# Patient Record
Sex: Female | Born: 1995 | Race: White | Hispanic: No | Marital: Single | Smoking: Current every day smoker
Health system: Southern US, Community
[De-identification: ages and names within clinical notes are randomized; demographics above are authoritative.]

## PROBLEM LIST (undated history)

## (undated) DIAGNOSIS — F329 Major depressive disorder, single episode, unspecified: Secondary | ICD-10-CM

## (undated) DIAGNOSIS — F32A Depression, unspecified: Secondary | ICD-10-CM

## (undated) DIAGNOSIS — F419 Anxiety disorder, unspecified: Secondary | ICD-10-CM

## (undated) HISTORY — PX: TONSILLECTOMY: SUR1361

## (undated) HISTORY — PX: TRACHEOSTOMY: SUR1362

---
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
## 2015-06-29 ENCOUNTER — Emergency Department
Admission: EM | Admit: 2015-06-29 | Discharge: 2015-06-30 | Disposition: A | Discharge status: Other Acute Inpatient Hospital | Payer: Medicaid Other | Attending: Emergency Medicine | Admitting: Emergency Medicine

## 2015-06-29 ENCOUNTER — Encounter: Payer: Self-pay | Admitting: Emergency Medicine

## 2015-06-29 DIAGNOSIS — F1210 Cannabis abuse, uncomplicated: Secondary | ICD-10-CM | POA: Insufficient documentation

## 2015-06-29 DIAGNOSIS — R45851 Suicidal ideations: Secondary | ICD-10-CM | POA: Diagnosis present

## 2015-06-29 DIAGNOSIS — F321 Major depressive disorder, single episode, moderate: Secondary | ICD-10-CM | POA: Insufficient documentation

## 2015-06-29 DIAGNOSIS — F1410 Cocaine abuse, uncomplicated: Secondary | ICD-10-CM | POA: Diagnosis not present

## 2015-06-29 DIAGNOSIS — F1510 Other stimulant abuse, uncomplicated: Secondary | ICD-10-CM | POA: Insufficient documentation

## 2015-06-29 DIAGNOSIS — Z720 Tobacco use: Secondary | ICD-10-CM | POA: Insufficient documentation

## 2015-06-29 DIAGNOSIS — Z3202 Encounter for pregnancy test, result negative: Secondary | ICD-10-CM | POA: Diagnosis not present

## 2015-06-29 LAB — CBC
HEMATOCRIT: 43.6 % (ref 35.0–47.0)
HEMOGLOBIN: 14.9 g/dL (ref 12.0–16.0)
MCH: 30.4 pg (ref 26.0–34.0)
MCHC: 34.1 g/dL (ref 32.0–36.0)
MCV: 89.3 fL (ref 80.0–100.0)
Platelets: 213 10*3/uL (ref 150–440)
RBC: 4.88 MIL/uL (ref 3.80–5.20)
RDW: 13 % (ref 11.5–14.5)
WBC: 9.1 10*3/uL (ref 3.6–11.0)

## 2015-06-29 LAB — COMPREHENSIVE METABOLIC PANEL
ALT: 10 U/L — ABNORMAL LOW (ref 14–54)
ANION GAP: 9 (ref 5–15)
AST: 13 U/L — ABNORMAL LOW (ref 15–41)
Albumin: 4.6 g/dL (ref 3.5–5.0)
Alkaline Phosphatase: 56 U/L (ref 38–126)
BILIRUBIN TOTAL: 0.3 mg/dL (ref 0.3–1.2)
BUN: 13 mg/dL (ref 6–20)
CO2: 21 mmol/L — ABNORMAL LOW (ref 22–32)
Calcium: 9.9 mg/dL (ref 8.9–10.3)
Chloride: 109 mmol/L (ref 101–111)
Creatinine, Ser: 0.62 mg/dL (ref 0.44–1.00)
Glucose, Bld: 100 mg/dL — ABNORMAL HIGH (ref 65–99)
POTASSIUM: 4.2 mmol/L (ref 3.5–5.1)
Sodium: 139 mmol/L (ref 135–145)
TOTAL PROTEIN: 7.8 g/dL (ref 6.5–8.1)

## 2015-06-29 LAB — URINE DRUG SCREEN, QUALITATIVE (ARMC ONLY)
AMPHETAMINES, UR SCREEN: NOT DETECTED
BENZODIAZEPINE, UR SCRN: NOT DETECTED
Barbiturates, Ur Screen: NOT DETECTED
COCAINE METABOLITE, UR ~~LOC~~: POSITIVE — AB
Cannabinoid 50 Ng, Ur ~~LOC~~: POSITIVE — AB
MDMA (Ecstasy)Ur Screen: NOT DETECTED
METHADONE SCREEN, URINE: POSITIVE — AB
OPIATE, UR SCREEN: NOT DETECTED
Phencyclidine (PCP) Ur S: NOT DETECTED
Tricyclic, Ur Screen: NOT DETECTED

## 2015-06-29 LAB — POCT PREGNANCY, URINE: Preg Test, Ur: NEGATIVE

## 2015-06-29 NOTE — ED Notes (Signed)
Report received from [facility] B.RN. Pt. Alert and oriented in no distress denies SI, HI, and pain.  Pt. Instructed to come to me with problems or concerns.Will continue to monitor for safety via security cameras and Q 15 minute checks.
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
## 2015-06-29 NOTE — ED Notes (Signed)
BEHAVIORAL HEALTH ROUNDING Patient sleeping: Yes.   Patient alert and oriented: not applicable Behavior appropriate: Yes.   Nutrition and fluids offered: No  Toileting and hygiene offered: No Sitter present: yes Law enforcement present: Yes  

## 2015-06-29 NOTE — ED Notes (Signed)
Pt to ed with IVC papers and [employer].  Pt states she went to [facility] because she has been hearing voices and seeing "visions"  Denies currently being on meds but states she wanted to get help,  Pt reports she has in the past had thoughts of hurting herself but states " I would never do that and hurt my family"  Pt sent from [facility] for "suicidal thoughts.  Pt crying at triage.

## 2015-06-29 NOTE — ED Notes (Signed)
MD at bedside. 

## 2015-06-29 NOTE — ED Notes (Signed)
Pt refusing to talk to family member at this time, phone number [phone].

## 2015-06-29 NOTE — ED Notes (Signed)
BEHAVIORAL HEALTH ROUNDING Patient sleeping: No. Patient alert and oriented: yes Behavior appropriate: Yes.  ; If no, describe: n/a Nutrition and fluids offered: Yes  Toileting and hygiene offered: No Sitter present: yes Law enforcement present: Yes

## 2015-06-29 NOTE — ED Provider Notes (Signed)
[facility] Emergency Department Provider Note  ____________________________________________  Time seen: On arrival  I have reviewed the triage vital signs and the nursing notes.   HISTORY  Chief Complaint Suicidal  History limited by patient's lack of cooperation  HPI Denise Gibbs is a 19 y.o. female who presented to [facility] and complained of SI and depression.They placed her under involuntary commitment and sent her to the ED. She will not tell me why she is depressed. She is tearful and upset. She denies ingestions or alcohol. She denies drug use to me     History reviewed. No pertinent past medical history.  There are no active problems to display for this patient.   Past Surgical History  Procedure Laterality Date  . Tonsillectomy      No current outpatient prescriptions on file.  Allergies Review of patient's allergies indicates no known allergies.  History reviewed. No pertinent family history.  Social History Social History  Substance Use Topics  . Smoking status: Current Every Day Smoker  . Smokeless tobacco: None  . Alcohol Use: Yes    Review of Systems   Eyes: Negative for visual changes. ENT: Negative for sore throat Cardiovascular: Negative for chest pain. Respiratory: Negative for shortness of breath. Gastrointestinal: Negative for abdominal pain, vomiting and diarrhea. Genitourinary: Negative for dysuria. Musculoskeletal: Negative for back pain. Skin: Negative for rash. Neurological: Negative for headaches or focal weakness Psychiatric: Positive depression positive suicidal ideation    ____________________________________________   PHYSICAL EXAM:  VITAL SIGNS: ED Triage Vitals  Enc Vitals Group     BP 06/29/15 1310 124/82 mmHg     Pulse Rate 06/29/15 1310 82     Resp 06/29/15 1310 20     Temp 06/29/15 1310 98.2 F (36.8 C)     Temp Source 06/29/15 1310 Oral     SpO2 06/29/15 1310 100 %     Weight  06/29/15 1310 214 lb (97.07 kg)     Height 06/29/15 1310  (1.651 m)     Head Cir --      Peak Flow --      Pain Score 06/29/15 1316 0     Pain Loc --      Pain Edu? --      Excl. in GC? --      Constitutional: Alert and oriented. Tearful and uncooperative Eyes: Conjunctivae are normal.  ENT   Head: Normocephalic and atraumatic.   Mouth/Throat: Mucous membranes are moist. Cardiovascular: Normal rate, regular rhythm. Normal and symmetric distal pulses are present in all extremities. No murmurs, rubs, or gallops. Respiratory: Normal respiratory effort without tachypnea nor retractions. Breath sounds are clear and equal bilaterally.  Gastrointestinal: Soft and non-tender in all quadrants. No distention. There is no CVA tenderness. Genitourinary: deferred Musculoskeletal: Nontender with normal range of motion in all extremities. No lower extremity tenderness nor edema. Neurologic:  Normal speech and language. No gross focal neurologic deficits are appreciated. Skin:  Skin is warm, dry and intact. No rash noted. Psychiatric: Mood and affect are normal. Patient exhibits appropriate insight and judgment.  ____________________________________________    LABS (pertinent positives/negatives)  Labs Reviewed  COMPREHENSIVE METABOLIC PANEL - Abnormal; Notable for the following:    CO2 21 (*)    Glucose, Bld 100 (*)    AST 13 (*)    ALT 10 (*)    All other components within normal limits  URINE DRUG SCREEN, QUALITATIVE ([facility] ONLY) - Abnormal; Notable for the following:  Cocaine Metabolite,Ur [place] POSITIVE (*)    Cannabinoid 50 Ng, Ur [place] POSITIVE (*)    Methadone Scn, Ur POSITIVE (*)    All other components within normal limits  CBC  POCT PREGNANCY, URINE    ____________________________________________   EKG  None  ____________________________________________    RADIOLOGY I have personally reviewed any xrays that were ordered on this  patient: None  ____________________________________________   PROCEDURES  Procedure(s) performed: none  Critical Care performed: none  ____________________________________________   INITIAL IMPRESSION / ASSESSMENT AND PLAN / ED COURSE  Pertinent labs & imaging results that were available during my care of the patient were reviewed by me and considered in my medical decision making (see chart for details).  Patient is under involuntary commitment. I have consulted TTS and psychiatry to evaluate her. At this point she is medically cleared  ____________________________________________   FINAL CLINICAL IMPRESSION(S) / ED DIAGNOSES  Final diagnoses:  Moderate single current episode of major depressive disorder [facility])     Jene Every, MD 06/29/15 [phone]

## 2015-06-29 NOTE — ED Notes (Signed)
Report called to [facility]. Pt to move to BHU 6

## 2015-06-29 NOTE — ED Notes (Signed)
BEHAVIORAL HEALTH ROUNDING Patient sleeping: No. Patient alert and oriented: yes Behavior appropriate: Yes.  ; pt is continuing to cry, upset about being here, waiting to see psychiatrist Nutrition and fluids offered: Yes  Toileting and hygiene offered: Yes  Sitter present: yes Law enforcement present: Yes

## 2015-06-29 NOTE — ED Notes (Signed)

## 2015-06-29 NOTE — ED Notes (Signed)
Pt. Noted in room resting quietly;. No complaints or concerns voiced. No distress or abnormal behavior noted. Will continue to monitor with security cameras. Q 15 minute rounds continue. 

## 2015-06-29 NOTE — ED Notes (Signed)
BEHAVIORAL HEALTH ROUNDING Patient sleeping: Yes.   Patient alert and oriented: yes Behavior appropriate: Yes.  ;  Nutrition and fluids offered: Yes  Toileting and hygiene offered: Yes  Sitter present: yes Law enforcement present: Yes  

## 2015-06-29 NOTE — ED Notes (Signed)
Pt. EDBHU from ED ambulatory without difficulty, to room B-6 . Report from RN. Pt. Is alert and oriented, warm and dry in no distress. Pt. Denies SI, HI, and AVH. Pt. Calm and cooperative. Pt. Made aware of security cameras and Q15 minute rounds. Pt. Encouraged to let Nursing staff know of any concerns or needs.

## 2015-06-29 NOTE — ED Notes (Signed)
ED BHU PLACEMENT JUSTIFICATION Is the patient under IVC or is there intent for IVC: Yes.   Is the patient medically cleared: Yes.   Is there vacancy in the ED BHU: No. Is the population mix appropriate for patient: Yes.   Is the patient awaiting placement in inpatient or outpatient setting: No. Has the patient had a psychiatric consult: No. Survey of unit performed for contraband, proper placement and condition of furniture, tampering with fixtures in bathroom, shower, and each patient room: Yes.  ; Findings: n/a APPEARANCE/BEHAVIOR Pt tearful on assessment, but cooperative. Stating she just wants to go home. NEURO ASSESSMENT Orientation: time, place and person Hallucinations: Yes.  Auditory Hallucinations Speech: Normal Gait: normal RESPIRATORY ASSESSMENT Normal expansion.  Clear to auscultation.  No rales, rhonchi, or wheezing. CARDIOVASCULAR ASSESSMENT Regular rate, rhythm  GASTROINTESTINAL ASSESSMENT soft, nontender, BS WNL, no r/g EXTREMITIES normal strength, tone, and muscle mass PLAN OF CARE Provide calm/safe environment. Vital signs assessed twice daily. ED BHU Assessment once each 12-hour shift. Collaborate with intake RN daily or as condition indicates. Assure the ED provider has rounded once each shift. Provide and encourage hygiene. Provide redirection as needed. Assess for escalating behavior; address immediately and inform ED provider.  Assess family dynamic and appropriateness for visitation as needed: Yes.  ; If necessary, describe findings: n/a Educate the patient/family about BHU procedures/visitation: Yes.  ; If necessary, describe findings: n/a

## 2015-06-29 NOTE — ED Notes (Signed)
BEHAVIORAL HEALTH ROUNDING Patient sleeping: Yes.   Patient alert and oriented: yes Behavior appropriate: Yes.  ; If no, describe: n/a Nutrition and fluids offered: Yes  Toileting and hygiene offered: Yes  Sitter present: yes Law enforcement present: Yes  
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
## 2015-06-30 ENCOUNTER — Inpatient Hospital Stay
Admission: AD | Admit: 2015-06-30 | Discharge: 2015-07-01 | DRG: 897 | Disposition: A | Discharge status: Home / Self Care | Payer: Medicaid Other | Source: Ambulatory Visit | Attending: Psychiatry | Admitting: Psychiatry

## 2015-06-30 DIAGNOSIS — Z813 Family history of other psychoactive substance abuse and dependence: Secondary | ICD-10-CM | POA: Diagnosis not present

## 2015-06-30 DIAGNOSIS — F17200 Nicotine dependence, unspecified, uncomplicated: Secondary | ICD-10-CM | POA: Diagnosis present

## 2015-06-30 DIAGNOSIS — Z818 Family history of other mental and behavioral disorders: Secondary | ICD-10-CM

## 2015-06-30 DIAGNOSIS — F1410 Cocaine abuse, uncomplicated: Secondary | ICD-10-CM

## 2015-06-30 DIAGNOSIS — F1220 Cannabis dependence, uncomplicated: Secondary | ICD-10-CM

## 2015-06-30 DIAGNOSIS — F1994 Other psychoactive substance use, unspecified with psychoactive substance-induced mood disorder: Secondary | ICD-10-CM | POA: Diagnosis not present

## 2015-06-30 DIAGNOSIS — G4700 Insomnia, unspecified: Secondary | ICD-10-CM | POA: Diagnosis present

## 2015-06-30 DIAGNOSIS — F3289 Other specified depressive episodes: Secondary | ICD-10-CM

## 2015-06-30 DIAGNOSIS — F1914 Other psychoactive substance abuse with psychoactive substance-induced mood disorder: Secondary | ICD-10-CM | POA: Diagnosis present

## 2015-06-30 DIAGNOSIS — R45851 Suicidal ideations: Secondary | ICD-10-CM | POA: Diagnosis present

## 2015-06-30 DIAGNOSIS — F1110 Opioid abuse, uncomplicated: Secondary | ICD-10-CM | POA: Diagnosis present

## 2015-06-30 DIAGNOSIS — Z9889 Other specified postprocedural states: Secondary | ICD-10-CM | POA: Diagnosis not present

## 2015-06-30 DIAGNOSIS — F19929 Other psychoactive substance use, unspecified with intoxication, unspecified: Secondary | ICD-10-CM

## 2015-06-30 DIAGNOSIS — Z6834 Body mass index (BMI) 34.0-34.9, adult: Secondary | ICD-10-CM | POA: Diagnosis not present

## 2015-06-30 DIAGNOSIS — F419 Anxiety disorder, unspecified: Secondary | ICD-10-CM | POA: Diagnosis present

## 2015-06-30 DIAGNOSIS — F329 Major depressive disorder, single episode, unspecified: Secondary | ICD-10-CM | POA: Diagnosis present

## 2015-06-30 DIAGNOSIS — F603 Borderline personality disorder: Secondary | ICD-10-CM

## 2015-06-30 DIAGNOSIS — Z62810 Personal history of physical and sexual abuse in childhood: Secondary | ICD-10-CM | POA: Diagnosis present

## 2015-06-30 DIAGNOSIS — F4010 Social phobia, unspecified: Secondary | ICD-10-CM | POA: Diagnosis present

## 2015-06-30 DIAGNOSIS — Z811 Family history of alcohol abuse and dependence: Secondary | ICD-10-CM | POA: Diagnosis not present

## 2015-06-30 DIAGNOSIS — E669 Obesity, unspecified: Secondary | ICD-10-CM | POA: Diagnosis present

## 2015-06-30 DIAGNOSIS — F1590 Other stimulant use, unspecified, uncomplicated: Secondary | ICD-10-CM

## 2015-06-30 DIAGNOSIS — R441 Visual hallucinations: Secondary | ICD-10-CM | POA: Diagnosis present

## 2015-06-30 MED ORDER — FLUOXETINE HCL 10 MG PO CAPS
10.0000 mg | ORAL_CAPSULE | Freq: Every day | ORAL | Status: DC
  Administered 2015-06-30 – 2015-07-01 (×2): 10 mg via ORAL
  Filled 2015-06-30 (×2): qty 1

## 2015-06-30 MED ORDER — MAGNESIUM HYDROXIDE 400 MG/5ML PO SUSP: 30.0000 mL | Freq: Every day | ORAL | Status: DC | PRN

## 2015-06-30 MED ORDER — RISPERIDONE 1 MG PO TABS
0.5000 mg | ORAL_TABLET | Freq: Two times a day (BID) | ORAL | Status: DC
  Administered 2015-06-30 (×2): 0.5 mg via ORAL
  Filled 2015-06-30 (×2): qty 1

## 2015-06-30 MED ORDER — INFLUENZA VAC SPLIT QUAD 0.5 ML IM SUSY
0.5000 mL | PREFILLED_SYRINGE | INTRAMUSCULAR | Status: AC
  Administered 2015-07-01: 0.5 mL via INTRAMUSCULAR
  Filled 2015-06-30: qty 0.5

## 2015-06-30 MED ORDER — ALUM & MAG HYDROXIDE-SIMETH 200-200-20 MG/5ML PO SUSP: 30.0000 mL | ORAL | Status: DC | PRN

## 2015-06-30 MED ORDER — ACETAMINOPHEN 325 MG PO TABS
650.0000 mg | ORAL_TABLET | Freq: Four times a day (QID) | ORAL | Status: DC | PRN
  Administered 2015-06-30: 650 mg via ORAL
  Filled 2015-06-30: qty 2

## 2015-06-30 MED ORDER — TRAZODONE HCL 100 MG PO TABS: 100.0000 mg | ORAL_TABLET | Freq: Every evening | ORAL | Status: DC | PRN

## 2015-06-30 MED ORDER — NICOTINE 14 MG/24HR TD PT24
14.0000 mg | MEDICATED_PATCH | Freq: Every day | TRANSDERMAL | Status: DC
  Administered 2015-06-30 – 2015-07-01 (×2): 14 mg via TRANSDERMAL
  Filled 2015-06-30 (×2): qty 1

## 2015-06-30 NOTE — [facility] Suicide Risk Assessment (Addendum)
[facility] Admission Suicide Risk Assessment   Nursing information obtained from:    Demographic factors:    Current Mental Status:    Loss Factors:    Historical Factors:    Risk Reduction Factors:    Total Time spent with patient: 1 hour Principal Problem: Substance or medication-induced depressive disorder with onset during intoxication [facility]) Diagnosis:   Patient Active Problem List   Diagnosis Date Noted  . 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 Stimulant use disorder (HCC) cocaine [F15.90] 06/30/2015  . Opioid use disorder, mild, abuse [F11.10] 06/30/2015  . Cannabis use disorder, moderate, dependence (HCC) [F12.20] 06/30/2015  . Tobacco use disorder [F17.200] 06/30/2015  . 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  World Science writer [facility]). Score between 0-7:  no or low risk or alcohol related problems. Score between 8-15:  moderate risk of alcohol related problems. Score between 16-19:  high risk of alcohol related problems. Score 20 or above:  warrants further diagnostic evaluation for alcohol dependence and treatment.   CLINICAL FACTORS:   Severe Anxiety and/or Agitation Depression:   Comorbid alcohol abuse/dependence Alcohol/Substance Abuse/Dependencies Personality Disorders:   Cluster B Comorbid alcohol abuse/dependence   Psychiatric Specialty Exam: Physical Exam  ROS    COGNITIVE FEATURES THAT CONTRIBUTE TO RISK:  None    SUICIDE RISK:   Mild:  Suicidal ideation of limited frequency, intensity, duration, and specificity.  There are no identifiable plans, no associated intent, mild dysphoria and related symptoms, good self-control (both objective and  subjective assessment), few other risk factors, and identifiable protective factors, including available and accessible social support.  PLAN OF CARE: admit to [facility]  Medical Decision Making:  Established Problem, Stable/Improving (1)  I certify that inpatient services furnished can reasonably be expected to improve the patient's condition.   Jimmy Footman 06/30/2015, 12:34 PM

## 2015-06-30 NOTE — ED Notes (Signed)
Pt. Noted in room. No complaints or concerns voiced. No distress or abnormal behavior noted. Will continue to monitor with security cameras. Q 15 minute rounds continue. 
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
## 2015-06-30 NOTE — Tx Team (Signed)
Initial Interdisciplinary Treatment Plan   PATIENT STRESSORS: Educational concerns Financial difficulties Marital or family conflict Occupational concerns Substance abuse   PATIENT STRENGTHS: Ability for insight Average or above average intelligence Capable of independent living Motivation for treatment/growth Supportive family/friends   PROBLEM LIST: Problem List/Patient Goals Date to be addressed Date deferred Reason deferred Estimated date of resolution  Substance Abuse 06/30/2015     Suicidal Ideations 06/30/2015     Depressive Symptoms 06/30/2015                                          DISCHARGE CRITERIA:  Ability to meet basic life and health needs Adequate post-discharge living arrangements Improved stabilization in mood, thinking, and/or behavior Motivation to continue treatment in a less acute level of care Need for constant or close observation no longer present Verbal commitment to aftercare and medication compliance Withdrawal symptoms are absent or subacute and managed without 24-hour nursing intervention  PRELIMINARY DISCHARGE PLAN: Attend aftercare/continuing care group Placement in alternative living arrangements Return to previous living arrangement  PATIENT/FAMIILY INVOLVEMENT: This treatment plan has been presented to and reviewed with the patient, Denise Gibbs.  The patient have been given the opportunity to ask questions and make suggestions.  Hetal Proano T Linley Moskal 06/30/2015, 7:09 AM

## 2015-06-30 NOTE — ED Notes (Signed)
Pt. Noted in room resting quietly;. No complaints or concerns voiced. No distress or abnormal behavior noted. Will continue to monitor with security cameras. Q 15 minute rounds continue. 

## 2015-06-30 NOTE — Progress Notes (Signed)
Patient with sad affect, cooperative behavior with meals, meds and plan of care. No SI/HI at this time. Good appetite and good adls. Minimal interaction with peers, appropriate when staff initiates interaction. Teary at times. Attends therapy group to learn and initiate coping skills for management of stressors and diagnosis. Safety maintained.
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
## 2015-06-30 NOTE — Plan of Care (Signed)
Problem: Consults Goal: [facility] Treatment Patient Education Outcome: Progressing No SI/HI at this time. Patient cooperative with current plan of care.

## 2015-06-30 NOTE — [facility] Group Notes (Signed)
[facility] Group Notes:  (Nursing/MHT/Case Management/Adjunct)  Date:  06/30/2015  Time:  2:33 PM  Type of Therapy:  Psychoeducational Skills  Participation Level:  Active  Participation Quality:  Appropriate  Affect:  Appropriate  Cognitive:  Appropriate  Insight:  Appropriate  Engagement in Group:  Engaged  Modes of Intervention:  Discussion, Education and Support  Summary of Progress/Problems:  Denise Gibbs 06/30/2015, 2:33 PM

## 2015-06-30 NOTE — BHH Group Notes (Signed)
[facility] LCSW Aftercare Discharge Planning Group Note   06/30/2015 1:07 PM  Participation Quality:  Patient attended and participated in group discussion sharing her SMART goal is to ""bring my anxiety down, get a job". Patient states she just go engaged and has that to look forward to.   Mood/Affect:  Depressed  Anxiety Rating:  6 out of 10  Thoughts of Suicide:  No Will you contract for safety?   NA  Current AVH:  No  Plan for Discharge/Comments:  Patient will discharge home with mental health follow up care.   Supports: Patient has family support and will have mental health support at discharge.   Lulu Riding, MSW, LCSWA

## 2015-06-30 NOTE — ED Notes (Signed)
Report given to AA, RN on the Behavioral Medicine Unit.

## 2015-06-30 NOTE — Progress Notes (Signed)
   06/30/15 1300  Clinical Encounter Type  Visited With Patient not available  Visit Type Initial  Referral From Nurse  Consult/Referral To Chaplain  Patient refused spiritual consult. Chap. Lanie Schelling G. Ai Sonnenfeld, ext. [phone] 

## 2015-07-01 MED ORDER — FLUOXETINE HCL 10 MG PO CAPS: 10.0000 mg | ORAL_CAPSULE | Freq: Every day | ORAL | Status: DC

## 2015-07-01 MED ORDER — TRAZODONE HCL 100 MG PO TABS: 100.0000 mg | ORAL_TABLET | Freq: Every evening | ORAL | Status: DC | PRN

## 2015-07-01 NOTE — Plan of Care (Signed)
Problem: Ineffective individual coping Goal: LTG: Patient will report a decrease in negative feelings Outcome: Progressing Patient denies SI/HI.      

## 2015-07-01 NOTE — Progress Notes (Addendum)
D: Pt denies SI/HI/AVH. Patient's affect is flat and sad, mood is pleasant and cooperative. Patient appears less anxious and she is interacting with peers and staff appropriately.  A: Pt was offered support and encouragement. Pt was given scheduled medications. Pt was encouraged to attend groups. Q 15 minute checks were done for safety.  R: Pt does not attends groups.. Pt is taking medication. Pt has no complaints.Pt receptive to treatment and safety maintained on unit.

## 2015-07-01 NOTE — Tx Team (Signed)
Interdisciplinary Treatment Plan Update (Adult)  Date:  07/01/2015 Time Reviewed:  11:15 AM  Progress in Treatment: Attending groups: Yes. Participating in groups:  Yes. Taking medication as prescribed:  Yes. Tolerating medication:  Yes. Family/Significant othe contact made:  Yes, individual(s) contacted:  patient's father Louretta Parma and uncle Matison Nuccio [phone] Patient understands diagnosis:  Yes. Discussing patient identified problems/goals with staff:  Yes. Medical problems stabilized or resolved:  Yes. Denies suicidal/homicidal ideation: Yes. Issues/concerns per patient self-inventory:  No. Other:  New problem(s) identified: No, Describe:  none reported  Discharge Plan or Barriers: Patient will discharge home with her father and uncle and followup [facility]  Reason for Continuation of Hospitalization: Depression Hallucinations Suicidal ideation  Comments:  Estimated length of stay: 0 days will discharge today 07/01/15  New goal(s):  Review of initial/current patient goals per problem list:   1.  Goal(s): hallucinations will no longer be present at discharge  Met:  Yes  Target date:07/01/15  2.  Goal (s): decrease depression  Met:  Yes  Target date:07/01/15  3.  Goal(s): participate in care and after care planning  Met:  Yes  Target date:07/01/15  Attendees: Physician:  Merlyn Albert, MD 10/6/201611:15 AM  Nursing:  Mordecai Rasmussen, RN 10/6/201611:15 AM  Other:  Carmell Austria, [place] 10/6/201611:15 AM  Other:   10/6/201611:15 AM  Other:   10/6/201611:15 AM  Other:  10/6/201611:15 AM  Other:  10/6/201611:15 AM  Other:  10/6/201611:15 AM  Other:  10/6/201611:15 AM  Other:  10/6/201611:15 AM  Other:  10/6/201611:15 AM  Other:   10/6/201611:15 AM   Scribe for Treatment Team:   Keene Breath, MSW, [place]  07/01/2015, 11:15 AM

## 2015-07-01 NOTE — Progress Notes (Signed)
  [facility] Adult Case Management Discharge Plan :  Will you be returning to the same living situation after discharge:  Yes,  home with her father and uncle At discharge, do you have transportation home?: Yes,  patient's uncle will pick up at discharge Do you have the ability to pay for your medications: Yes,  patient has Medicaid  Release of information consent forms completed and in the chart;  Patient's signature needed at discharge.  Patient to Follow up at: Follow-up Information    Follow up with [facility]. Go on 07/07/2015.   Why:  For follow-up care appt Wednesday 07/07/15 at 7:00am during walk in hours   Contact information:   [address] [place], [place] Ph [phone] Fax [phone]      Patient denies SI/HI: Yes,  patient denies SI/HI    Safety Planning and Suicide Prevention discussed: Yes,  SPE performed with patient and her uncle Hollace Michelli [phone]  Have you used any form of tobacco in the last 30 days? (Cigarettes, Smokeless Tobacco, Cigars, and/or Pipes): Yes  Has patient been referred to the Quitline?: Patient refused referral  Lulu Riding, MSW, LCSWA 07/01/2015, 11:20 AM

## 2015-07-01 NOTE — [facility] Suicide Risk Assessment (Signed)
[facility] Discharge Suicide Risk Assessment   Demographic Factors:  Adolescent or young adult, Caucasian, Low socioeconomic status and Unemployed  Total Time spent with patient: 30 minutes   Psychiatric Specialty Exam: Physical Exam  ROS  Have you used any form of tobacco in the last 30 days? (Cigarettes, Smokeless Tobacco, Cigars, and/or Pipes): Yes  Has this patient used any form of tobacco in the last 30 days? (Cigarettes, Smokeless Tobacco, Cigars, and/or Pipes) Yes, A prescription for an FDA-approved tobacco cessation medication was offered at discharge and the patient refused  Mental Status Per Nursing Assessment::   On Admission:     Current Mental Status by Physician: Euthymic, affect reactive and appropriate. Denies SI denies HI. Hopeful and future oriented. Denies feelings of hopelessness, helplessness or worthlessness. There is no evidence of psychosis, anxiety, aggression or agitation. Patient is calm, pleasant and cooperative  Loss Factors: Financial problems/change in socioeconomic status  Historical Factors: Prior suicide attempts and Family history of mental illness or substance abuse  Risk Reduction Factors:   Sense of responsibility to family, Living with another person, especially a relative and Positive social support  Continued Clinical Symptoms:  Alcohol/Substance Abuse/Dependencies Previous Psychiatric Diagnoses and Treatments  Cognitive Features That Contribute To Risk:  None    Suicide Risk:  Minimal: No identifiable suicidal ideation.  Patients presenting with no risk factors but with morbid ruminations; may be classified as minimal risk based on the severity of the depressive symptoms  Principal Problem: Substance or medication-induced depressive disorder with onset during intoxication [facility]) Discharge Diagnoses:  Patient Active Problem List   Diagnosis Date Noted  . 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 Stimulant use disorder (HCC) cocaine [F15.90] 06/30/2015  . Opioid use disorder, mild, abuse [F11.10] 06/30/2015  . Cannabis use disorder, moderate, dependence (HCC) [F12.20] 06/30/2015  . Tobacco use disorder [F17.200] 06/30/2015  . 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 Go on 07/03/2015.   Why:  For follow-up care appt    Contact information:   [address] [place], [place] Ph [phone] Fax [phone]        Is patient on multiple antipsychotic therapies at discharge:  No   Has Patient had three or more failed trials of antipsychotic monotherapy by history:  No  Recommended Plan for Multiple Antipsychotic Therapies: NA    Jimmy Footman 07/01/2015, 10:32 AM

## 2015-07-01 NOTE — Progress Notes (Signed)
Patient being discharged home with boyfriend. Instructions reviewed. Patient verbalized understanding of meds and follow up care. Belongings returned. Denies SI/HI/AVH.
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
## 2015-07-01 NOTE — [facility] Suicide Risk Assessment (Signed)
[facility] INPATIENT:  Family/Significant Other Suicide Prevention Education  Suicide Prevention Education:  Education Completed; Hildur Bayer (uncle) [phone] has been identified by the patient as the family member/significant other with whom the patient will be residing, and identified as the person(s) who will aid the patient in the event of a mental health crisis (suicidal ideations/suicide attempt).  With written consent from the patient, the family member/significant other has been provided the following suicide prevention education, prior to the and/or following the discharge of the patient.  The suicide prevention education provided includes the following:  Suicide risk factors  Suicide prevention and interventions  National Suicide Hotline telephone number  [facility] assessment telephone number  [facility] Emergency Assistance 911  [facility] and/or Residential Mobile Crisis Unit telephone number  Request made of family/significant other to:  Remove weapons (e.g., guns, rifles, knives), all items previously/currently identified as safety concern.    Remove drugs/medications (over-the-counter, prescriptions, illicit drugs), all items previously/currently identified as a safety concern.  The family member/significant other verbalizes understanding of the suicide prevention education information provided.  The family member/significant other agrees to remove the items of safety concern listed above.  Lulu Riding, MSW, LCSWA 07/01/2015, 9:56 AM

## 2015-08-13 ENCOUNTER — Other Ambulatory Visit: Payer: Self-pay | Admitting: Psychiatry

## 2015-08-13 DIAGNOSIS — F333 Major depressive disorder, recurrent, severe with psychotic symptoms: Secondary | ICD-10-CM

## 2015-08-16 ENCOUNTER — Ambulatory Visit: Payer: Medicaid Other | Attending: Psychiatry

## 2015-08-16 DIAGNOSIS — F333 Major depressive disorder, recurrent, severe with psychotic symptoms: Secondary | ICD-10-CM | POA: Diagnosis present

## 2016-07-04 ENCOUNTER — Emergency Department
Admission: EM | Admit: 2016-07-04 | Discharge: 2016-07-05 | Disposition: A | Discharge status: Home / Self Care | Payer: Medicaid Other | Attending: Emergency Medicine | Admitting: Emergency Medicine

## 2016-07-04 ENCOUNTER — Encounter: Payer: Self-pay | Admitting: Emergency Medicine

## 2016-07-04 DIAGNOSIS — R45851 Suicidal ideations: Secondary | ICD-10-CM

## 2016-07-04 DIAGNOSIS — F332 Major depressive disorder, recurrent severe without psychotic features: Secondary | ICD-10-CM

## 2016-07-04 DIAGNOSIS — F603 Borderline personality disorder: Secondary | ICD-10-CM | POA: Diagnosis present

## 2016-07-04 DIAGNOSIS — F1994 Other psychoactive substance use, unspecified with psychoactive substance-induced mood disorder: Secondary | ICD-10-CM | POA: Insufficient documentation

## 2016-07-04 DIAGNOSIS — F329 Major depressive disorder, single episode, unspecified: Secondary | ICD-10-CM | POA: Insufficient documentation

## 2016-07-04 DIAGNOSIS — F1410 Cocaine abuse, uncomplicated: Secondary | ICD-10-CM | POA: Diagnosis present

## 2016-07-04 DIAGNOSIS — F1220 Cannabis dependence, uncomplicated: Secondary | ICD-10-CM | POA: Diagnosis present

## 2016-07-04 DIAGNOSIS — F19929 Other psychoactive substance use, unspecified with intoxication, unspecified: Secondary | ICD-10-CM | POA: Diagnosis present

## 2016-07-04 DIAGNOSIS — F1010 Alcohol abuse, uncomplicated: Secondary | ICD-10-CM

## 2016-07-04 DIAGNOSIS — F1590 Other stimulant use, unspecified, uncomplicated: Secondary | ICD-10-CM | POA: Diagnosis present

## 2016-07-04 DIAGNOSIS — F17200 Nicotine dependence, unspecified, uncomplicated: Secondary | ICD-10-CM | POA: Insufficient documentation

## 2016-07-04 DIAGNOSIS — F3289 Other specified depressive episodes: Secondary | ICD-10-CM

## 2016-07-04 DIAGNOSIS — T43212A Poisoning by selective serotonin and norepinephrine reuptake inhibitors, intentional self-harm, initial encounter: Secondary | ICD-10-CM | POA: Insufficient documentation

## 2016-07-04 HISTORY — DX: Major depressive disorder, single episode, unspecified: F32.9

## 2016-07-04 HISTORY — DX: Depression, unspecified: F32.A

## 2016-07-04 HISTORY — DX: Anxiety disorder, unspecified: F41.9

## 2016-07-04 LAB — CBC
HEMATOCRIT: 38.3 % (ref 35.0–47.0)
Hemoglobin: 12.8 g/dL (ref 12.0–16.0)
MCH: 28.7 pg (ref 26.0–34.0)
MCHC: 33.3 g/dL (ref 32.0–36.0)
MCV: 86 fL (ref 80.0–100.0)
Platelets: 226 10*3/uL (ref 150–440)
RBC: 4.45 MIL/uL (ref 3.80–5.20)
RDW: 13.5 % (ref 11.5–14.5)
WBC: 11.1 10*3/uL — ABNORMAL HIGH (ref 3.6–11.0)

## 2016-07-04 LAB — URINE DRUG SCREEN, QUALITATIVE (ARMC ONLY)
Amphetamines, Ur Screen: NOT DETECTED
BENZODIAZEPINE, UR SCRN: NOT DETECTED
Barbiturates, Ur Screen: NOT DETECTED
Cannabinoid 50 Ng, Ur ~~LOC~~: POSITIVE — AB
Cocaine Metabolite,Ur ~~LOC~~: POSITIVE — AB
MDMA (Ecstasy)Ur Screen: NOT DETECTED
METHADONE SCREEN, URINE: NOT DETECTED
OPIATE, UR SCREEN: NOT DETECTED
PHENCYCLIDINE (PCP) UR S: NOT DETECTED
Tricyclic, Ur Screen: NOT DETECTED

## 2016-07-04 LAB — COMPREHENSIVE METABOLIC PANEL
ALBUMIN: 3.8 g/dL (ref 3.5–5.0)
ALK PHOS: 73 U/L (ref 38–126)
ALT: 15 U/L (ref 14–54)
AST: 15 U/L (ref 15–41)
Anion gap: 9 (ref 5–15)
BILIRUBIN TOTAL: 0.4 mg/dL (ref 0.3–1.2)
BUN: 13 mg/dL (ref 6–20)
CALCIUM: 9.4 mg/dL (ref 8.9–10.3)
CO2: 23 mmol/L (ref 22–32)
CREATININE: 0.72 mg/dL (ref 0.44–1.00)
Chloride: 106 mmol/L (ref 101–111)
GFR calc Af Amer: >60 mL/min (ref >60)
GFR calc non Af Amer: >60 mL/min (ref >60)
GLUCOSE: 90 mg/dL (ref 65–99)
Potassium: 3.8 mmol/L (ref 3.5–5.1)
Sodium: 138 mmol/L (ref 135–145)
TOTAL PROTEIN: 7.9 g/dL (ref 6.5–8.1)

## 2016-07-04 LAB — ACETAMINOPHEN LEVEL

## 2016-07-04 LAB — ETHANOL: Alcohol, Ethyl (B): <5 mg/dL (ref <5)

## 2016-07-04 LAB — SALICYLATE LEVEL: Salicylate Lvl: <7 mg/dL (ref 2.8–30.0)

## 2016-07-04 LAB — POCT PREGNANCY, URINE: Preg Test, Ur: NEGATIVE

## 2016-07-04 MED ORDER — TRAZODONE HCL 100 MG PO TABS
100.0000 mg | ORAL_TABLET | Freq: Every day | ORAL | Status: DC
  Administered 2016-07-04: 100 mg via ORAL
  Filled 2016-07-04: qty 1

## 2016-07-04 MED ORDER — FLUOXETINE HCL 10 MG PO CAPS
10.0000 mg | ORAL_CAPSULE | Freq: Every day | ORAL | Status: DC
  Administered 2016-07-05: 10 mg via ORAL
  Filled 2016-07-04: qty 1

## 2016-07-04 NOTE — ED Provider Notes (Signed)
[facility] Emergency Department Provider Note  Time seen: 8:28 PM  I have reviewed the triage vital signs and the nursing notes.   HISTORY  Chief Complaint Mental Health Problem    HPI Denise Gibbs is a 20 y.o. female with a past medical history of anxiety and depression presents to the emergency department under IVC for suicidal attempt. According to the patient and IVC report the patient was upset at her fianc yesterday, drank a substantial amount of liquor and took 5 trazodone pills. Patient states she did this to make her fianc mad. States she made herself vomit after taking the medication and then went to bed. Today her family found out about this, [facility] where the patient was placed under involuntary commitment and brought to the hospital for further treatment and evaluation. Patient states she does not want to kill her self she was just very upset at this time. Does admit to alcohol and cocaine and marijuana use.  Past Medical History:  Diagnosis Date  . Anxiety   . Depression     Patient Active Problem List   Diagnosis Date Noted  . Social phobia 06/30/2015  . 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 Stimulant use disorder (HCC) cocaine 06/30/2015  . Opioid use disorder, mild, abuse 06/30/2015  . Cannabis use disorder, moderate, dependence (HCC) 06/30/2015  . Tobacco use disorder 06/30/2015  . Borderline traits 06/30/2015    Past Surgical History:  Procedure Laterality Date  . TONSILLECTOMY      Prior to Admission medications   Medication Sig Start Date End Date Taking? Authorizing Provider  FLUoxetine (PROZAC) 10 MG capsule Take 1 capsule (10 mg total) by mouth daily. 07/01/15   Jimmy FootmanAndrea Hernandez-Gonzalez, MD  traZODone (DESYREL) 100 MG tablet Take 1 tablet (100 mg total) by mouth at bedtime as needed for sleep. 07/01/15   Jimmy FootmanAndrea Hernandez-Gonzalez, MD    No Known  Allergies  History reviewed. No pertinent family history.  Social History Social History  Substance Use Topics  . Smoking status: Current Every Day Smoker  . Smokeless tobacco: Never Used  . Alcohol use Yes    Review of Systems Constitutional: Negative for fever. Cardiovascular: Negative for chest pain. Respiratory: Negative for shortness of breath. Gastrointestinal: Negative for abdominal pain Musculoskeletal: Negative for back pain. Neurological: Negative for headache 10-point ROS otherwise negative.  ____________________________________________   PHYSICAL EXAM:  VITAL SIGNS: ED Triage Vitals [07/04/16 1927]  Enc Vitals Group     BP 131/84     Pulse Rate 81     Resp 15     Temp 98.7 F (37.1 C)     Temp Source Oral     SpO2 98 %     Weight (!) 311 lb (141.1 kg)     Height 5\' 5"  (1.651 m)     Head Circumference      Peak Flow      Pain Score      Pain Loc      Pain Edu?      Excl. in GC?     Constitutional: Alert and oriented. Well appearing and in no distress. Eyes: Normal exam ENT   Head: Normocephalic and atraumatic   Mouth/Throat: Mucous membranes are moist. Cardiovascular: Normal rate, regular rhythm. No murmur Respiratory: Normal respiratory effort without tachypnea nor retractions. Breath sounds are clear  Gastrointestinal: Soft and nontender. No distention.  Musculoskeletal: Nontender with normal range of motion in all  extremities.  Neurologic:  Normal speech and language. No gross focal neurologic deficits  Skin:  Skin is warm, dry and intact.  Psychiatric: Mood and affect are normal.Denies SI or HI.  ____________________________________________   INITIAL IMPRESSION / ASSESSMENT AND PLAN / ED COURSE  Pertinent labs & imaging results that were available during my care of the patient were reviewed by me and considered in my medical decision making (see chart for details).  The patient presents the emergency department after a possible  suicide attempt last night. Currently the patient appears well with no complaints. Patient admits to taking trazodone and drinking as stated amount of alcohol, but states showing to this because she was upset at her fianc. Denies wanting to die. Patient has no medical complaints today. Patient is under an involuntary commitment. Labs are largely within normal limits, cocaine and cannabinoids positive urine toxicology. Patient medically cleared awaiting psychiatric evaluation.  ____________________________________________   FINAL CLINICAL IMPRESSION(S) / ED DIAGNOSES  Suicidal ideation    Minna Antis, MD 07/04/16 2033

## 2016-07-04 NOTE — BH Assessment (Signed)
Assessment Note  Denise Gibbs is an 20 y.o. female presenting to the ED, under involuntary commitment, from [facility].  According to the IVC paperwork, patient states that her boyfriend admitted to watching pornography yesterday.  She states that she became angry and wanted to scare him.  She reports taking some of her Trazadone and drinking an entire bottle of whisky, thinking that her boyfriend would stop her.  The paperwork states that when patient's family found her, patient was unresponsive and her lips were turning blue.  Patient denies wanting to harm herself and states that she has been compliant with her mental health treatment and medications.  Diagnosis: Depression  Past Medical History:  Past Medical History:  Diagnosis Date  . Anxiety   . Depression     Past Surgical History:  Procedure Laterality Date  . TONSILLECTOMY      Family History: History reviewed. No pertinent family history.  Social History:  reports that she has been smoking.  She has never used smokeless tobacco. She reports that she drinks alcohol. She reports that she uses drugs, including Marijuana.  Additional Social History:  Alcohol / Drug Use History of alcohol / drug use?: No history of alcohol / drug abuse  CIWA: CIWA-Ar BP: 131/84 Pulse Rate: 81 COWS:    Allergies: No Known Allergies  Home Medications:  (Not in a hospital admission)  OB/GYN Status:  Patient's last menstrual period was 06/25/2016.  General Assessment Data [facility] ED TTS Assessment: In system Is this a Tele or Face-to-Face Assessment?: Face-to-Face Is this an Initial Assessment or a Re-assessment for this encounter?: Initial Assessment Marital status: Single Maiden name: n/a Is patient pregnant?: No Pregnancy Status: No Living Arrangements: Parent Can pt return to current living arrangement?: Yes Admission Status: Involuntary Is patient capable of signing voluntary admission?: Yes Referral  Source: Psychiatrist Insurance type: Medicaid  Medical Screening Exam [facility]-in ONLY) Medical Exam completed: Yes  Crisis Care Plan Living Arrangements: Parent Legal Guardian: Other: (self) Name of Psychiatrist: Dr. Georjean ModeLitz Name of Therapist: [facility]  Education Status Is patient currently in school?: No Current Grade: n/a Highest grade of school patient has completed: 9th Name of school: n/a Contact person: n/a  Risk to self with the past 6 months Suicidal Ideation: Yes-Currently Present Has patient been a risk to self within the past 6 months prior to admission? : No Suicidal Intent: No Has patient had any suicidal intent within the past 6 months prior to admission? : No Is patient at risk for suicide?: No Suicidal Plan?: No Has patient had any suicidal plan within the past 6 months prior to admission? : No Access to Means: Yes Specify Access to Suicidal Means: Pt has access to pills and alcohol What has been your use of drugs/alcohol within the last 12 months?: alcohol Previous Attempts/Gestures: Yes How many times?: 1 Other Self Harm Risks: none identified Triggers for Past Attempts: Other personal contacts Intentional Self Injurious Behavior: None Family Suicide History: No Recent stressful life event(s): Conflict (Comment) (conflict with boyfriend) Persecutory voices/beliefs?: No Depression: Yes Depression Symptoms: Loss of interest in usual pleasures, Feeling worthless/self pity Substance abuse history and/or treatment for substance abuse?: Yes Suicide prevention information given to non-admitted patients: Not applicable  Risk to Others within the past 6 months Homicidal Ideation: No Does patient have any lifetime risk of violence toward others beyond the six months prior to admission? : No Thoughts of Harm to Others: No Current Homicidal Intent: No Current Homicidal Plan:  No Access to Homicidal Means: No Identified Victim: none identified History of harm to  others?: No Assessment of Violence: None Noted Violent Behavior Description: none identified Does patient have access to weapons?: No Criminal Charges Pending?: No Does patient have a court date: No Is patient on probation?: No  Psychosis Hallucinations: None noted Delusions: None noted  Mental Status Report Appearance/Hygiene: In scrubs Eye Contact: Good Motor Activity: Freedom of movement Speech: Logical/coherent Level of Consciousness: Alert Mood: Anxious, Depressed Affect: Appropriate to circumstance, Depressed, Anxious Anxiety Level: Minimal Thought Processes: Coherent, Relevant Judgement: Partial Orientation: Person, Place, Time, Situation Obsessive Compulsive Thoughts/Behaviors: None  Cognitive Functioning Concentration: Normal Memory: Recent Intact, Remote Intact IQ: Average Insight: Poor Impulse Control: Poor Appetite: Good Weight Loss: 0 Weight Gain: 100 Sleep: No Change Total Hours of Sleep: 8 Vegetative Symptoms: None  ADLScreening [facility] Assessment Services) Patient's cognitive ability adequate to safely complete daily activities?: Yes Patient able to express need for assistance with ADLs?: Yes Independently performs ADLs?: Yes (appropriate for developmental age)  Prior Inpatient Therapy Prior Inpatient Therapy: Yes Prior Therapy Dates: 2016 Prior Therapy Facilty/Provider(s): [facility] Reason for Treatment: depression  Prior Outpatient Therapy Prior Outpatient Therapy: Yes Prior Therapy Dates: current Prior Therapy Facilty/Provider(s): [facility] Reason for Treatment: depression Does patient have an ACCT team?: No Does patient have Intensive In-House Services?  : No Does patient have [facility] services? : No Does patient have P4CC services?: No  ADL Screening (condition at time of admission) Patient's cognitive ability adequate to safely complete daily activities?: Yes Patient able to express need for assistance with ADLs?: Yes Independently performs ADLs?:  Yes (appropriate for developmental age)       Abuse/Neglect Assessment (Assessment to be complete while patient is alone) Physical Abuse: Denies Verbal Abuse: Denies Sexual Abuse: Denies Exploitation of patient/patient's resources: Denies Self-Neglect: Denies Values / Beliefs Cultural Requests During Hospitalization: None Spiritual Requests During Hospitalization: None Consults Spiritual Care Consult Needed: No Social Work Consult Needed: No      Additional Information 1:1 In Past 12 Months?: No CIRT Risk: No Elopement Risk: No Does patient have medical clearance?: Yes     Disposition:  Disposition Initial Assessment Completed for this Encounter: Yes Disposition of Patient: Other dispositions Other disposition(s): Other (Comment) (Pending Psych MD consult)  On Site Evaluation by:   Reviewed with Physician:    Sujey Gundry C Meilin Brosh 07/04/2016 10:15 PM

## 2016-07-04 NOTE — ED Triage Notes (Addendum)
Pt arrived to triage with [employer]. Pt to ED after IVC due to pt drinking liquor and taking 5 100mg  trazodone on 07/03/16, [place] sheriffs reported to house after parents of pt called. Pt sts, "I don't want to die, I was just trying to scare my boyfriend." Pt denies SI/HI at time of triage.  Pt tearful and sts "I don't want to be here, I still have nightmares of this place."

## 2016-07-04 NOTE — ED Notes (Signed)
BEHAVIORAL HEALTH ROUNDING Patient sleeping: No. Patient alert and oriented: yes Behavior appropriate: Yes.  ; If no, describe:  Nutrition and fluids offered: yes Toileting and hygiene offered: Yes  Sitter present: q15 minute observations and security  monitoring Law enforcement present: Yes  ODS  

## 2016-07-05 DIAGNOSIS — F332 Major depressive disorder, recurrent severe without psychotic features: Secondary | ICD-10-CM

## 2016-07-05 DIAGNOSIS — F1994 Other psychoactive substance use, unspecified with psychoactive substance-induced mood disorder: Secondary | ICD-10-CM

## 2016-07-05 DIAGNOSIS — F1010 Alcohol abuse, uncomplicated: Secondary | ICD-10-CM

## 2016-07-05 NOTE — ED Provider Notes (Signed)
-----------------------------------------   7:37 AM on 07/05/2016 -----------------------------------------  Vitals:   07/04/16 1927 07/05/16 0639  BP: 131/84 126/69  Pulse: 81 71  Resp: 15 18  Temp: 98.7 F (37.1 C) 98.6 F (37 C)   No acute events reported to me overnight. Patient is under IVC and awaiting to be seen by the psychiatrist today.   Governor Rooksebecca Vallie Fayette, MD 07/05/16 [id number]

## 2016-07-05 NOTE — Consult Note (Signed)
[place] [facility] Consult   Reason for Consult:  Consult for 20 year old woman with history of chronic mood instability who was brought to the hospital by family Referring Physician:  Burlene Arnt Patient Identification: Denise Gibbs MRN:  [id number] Principal Diagnosis: Substance or medication-induced depressive disorder with onset during intoxication [facility]) Diagnosis:   Patient Active Problem List   Diagnosis Date Noted  . Severe recurrent major depression ([place]) [F33.2] 07/05/2016  . Alcohol abuse [F10.10] 07/05/2016  . 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 Substance or medication-induced depressive disorder with onset during intoxication ([place]) [F19.94] 06/30/2015  . 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 Opioid use disorder, mild, abuse [F11.10] 06/30/2015  . Cannabis use disorder, moderate, dependence ([place]) [F12.20] 06/30/2015  . Tobacco use disorder [F17.200] 06/30/2015  . 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 female patient admitted with "I was just trying to make him mad".  HPI:  Patient interviewed. Chart reviewed. Labs reviewed. 20 year old woman with a history of chronic mood instability brought to the hospital by family. The night before last, Monday evening, she showed down most of a pint bottle of whiskey and took some extra trazodone. She did this in front of her boyfriend because she said she wanted to make him mad. She was upset at him at the time. Patient did not come into the hospital at that time her brother slept it off. Yesterday family found out about this and insisted on bringing her to the hospital. She was lucid and interactive when she got here yesterday. Alcohol level was negative. Does have cocaine in her system. Admits that she's been continuing to use cocaine and marijuana regularly. She denies that there was any suicidal thought or intent  in the behavior that she had. She says most of the time her mood is staying pretty stable recently. She is compliant with outpatient treatment through [facility] and sees a therapist and takes medicine. She has insight into her substance abuse problem and says that she wants to try and get help for that. She admits having occasional hallucinations which are chronic but says those of gotten better with her current medicine. No homicidal ideation. She and her boyfriend have some chronic stress between them but there is no other immediate issue.  Social history: Lives with her boyfriend and several extended members of her family. Not working currently. Goes to [employer]. Has a peer support who is helping her to try to get work. No children involved.  Medical history: Weight no other significant ongoing medical problems.  Substance abuse history: History of abuse of multiple substances including cocaine and opiates alcohol cannabis.  Past Psychiatric History: Patient has a past history of suicide attempts years ago but nothing in the last couple years. She has not had a hospitalization in over a year. She is compliant with outpatient treatment. Has a lot of borderline traits and substance abuse problems and chronic depressed mood.  Risk to Self: Suicidal Ideation: Yes-Currently Present Suicidal Intent: No Is patient at risk for suicide?: No Suicidal Plan?: No Access to Means: Yes Specify Access to Suicidal Means: Pt has access to pills and alcohol What has been your use of drugs/alcohol within the last 12 months?: alcohol How many times?: 1 Other Self Harm Risks: none identified Triggers for Past Attempts: Other personal contacts Intentional Self Injurious Behavior: None Risk to Others: Homicidal Ideation: No Thoughts of Harm  to Others: No Current Homicidal Intent: No Current Homicidal Plan: No Access to Homicidal Means: No Identified Victim: none identified History of harm to others?: No Assessment of  Violence: None Noted Violent Behavior Description: none identified Does patient have access to weapons?: No Criminal Charges Pending?: No Does patient have a court date: No Prior Inpatient Therapy: Prior Inpatient Therapy: Yes Prior Therapy Dates: 2016 Prior Therapy Facilty/Provider(s): [facility] Reason for Treatment: depression Prior Outpatient Therapy: Prior Outpatient Therapy: Yes Prior Therapy Dates: current Prior Therapy Facilty/Provider(s): [facility] Reason for Treatment: depression Does patient have an ACCT team?: No Does patient have Intensive In-House Services?  : No Does patient have [facility] services? : No Does patient have P4CC services?: No  Past Medical History:  Past Medical History:  Diagnosis Date  . Anxiety   . Depression     Past Surgical History:  Procedure Laterality Date  . TONSILLECTOMY     Family History: History reviewed. No pertinent family history. Family Psychiatric  History: Family history positive for mood instability and 2 siblings and her mother Social History:  History  Alcohol Use  . Yes     History  Drug Use  . Types: Marijuana    Social History   Social History  . Marital status: Single    Spouse name: N/A  . Number of children: N/A  . Years of education: N/A   Social History Main Topics  . Smoking status: Current Every Day Smoker  . Smokeless tobacco: Never Used  . Alcohol use Yes  . Drug use:     Types: Marijuana  . Sexual activity: Not Asked   Other Topics Concern  . None   Social History Narrative  . None   Additional Social History:    Allergies:  No Known Allergies  Labs:  Results for orders placed or performed during the hospital encounter of 07/04/16 (from the past 48 hour(s))  Comprehensive metabolic panel     Status: None   Collection Time: 07/04/16  7:34 PM  Result Value Ref Range   Sodium 138 135 - 145 mmol/L   Potassium 3.8 3.5 - 5.1 mmol/L   Chloride 106 101 - 111 mmol/L   CO2 23 22 - 32 mmol/L   Glucose,  Bld 90 65 - 99 mg/dL   BUN 13 6 - 20 mg/dL   Creatinine, Ser 0.72 0.44 - 1.00 mg/dL   Calcium 9.4 8.9 - 10.3 mg/dL   Total Protein 7.9 6.5 - 8.1 g/dL   Albumin 3.8 3.5 - 5.0 g/dL   AST 15 15 - 41 U/L   ALT 15 14 - 54 U/L   Alkaline Phosphatase 73 38 - 126 U/L   Total Bilirubin 0.4 0.3 - 1.2 mg/dL   GFR calc non Af Amer >60 >60 mL/min   GFR calc Af Amer >60 >60 mL/min    Comment: (NOTE) The eGFR has been calculated using the CKD EPI equation. This calculation has not been validated in all clinical situations. eGFR's persistently <60 mL/min signify possible Chronic Kidney Disease.    Anion gap 9 5 - 15  Ethanol     Status: None   Collection Time: 07/04/16  7:34 PM  Result Value Ref Range   Alcohol, Ethyl (B) <5 <5 mg/dL    Comment:        LOWEST DETECTABLE LIMIT FOR SERUM ALCOHOL IS 5 mg/dL FOR MEDICAL PURPOSES ONLY   Salicylate level     Status: None   Collection Time: 07/04/16  7:34 PM  Result Value Ref Range   Salicylate Lvl <3.3 2.8 - 30.0 mg/dL  Acetaminophen level     Status: Abnormal   Collection Time: 07/04/16  7:34 PM  Result Value Ref Range   Acetaminophen (Tylenol), Serum <10 (L) 10 - 30 ug/mL    Comment:        THERAPEUTIC CONCENTRATIONS VARY SIGNIFICANTLY. A RANGE OF 10-30 ug/mL MAY BE AN EFFECTIVE CONCENTRATION FOR MANY PATIENTS. HOWEVER, SOME ARE BEST TREATED AT CONCENTRATIONS OUTSIDE THIS RANGE. ACETAMINOPHEN CONCENTRATIONS >150 ug/mL AT 4 HOURS AFTER INGESTION AND >50 ug/mL AT 12 HOURS AFTER INGESTION ARE OFTEN ASSOCIATED WITH TOXIC REACTIONS.   cbc     Status: Abnormal   Collection Time: 07/04/16  7:34 PM  Result Value Ref Range   WBC 11.1 (H) 3.6 - 11.0 K/uL   RBC 4.45 3.80 - 5.20 MIL/uL   Hemoglobin 12.8 12.0 - 16.0 g/dL   HCT 38.3 35.0 - 47.0 %   MCV 86.0 80.0 - 100.0 fL   MCH 28.7 26.0 - 34.0 pg   MCHC 33.3 32.0 - 36.0 g/dL   RDW 13.5 11.5 - 14.5 %   Platelets 226 150 - 440 K/uL  Urine Drug Screen, Qualitative     Status: Abnormal    Collection Time: 07/04/16  7:34 PM  Result Value Ref Range   Tricyclic, Ur Screen NONE DETECTED NONE DETECTED   Amphetamines, Ur Screen NONE DETECTED NONE DETECTED   MDMA (Ecstasy)Ur Screen NONE DETECTED NONE DETECTED   Cocaine Metabolite,Ur [place] POSITIVE (A) NONE DETECTED   Opiate, Ur Screen NONE DETECTED NONE DETECTED   Phencyclidine (PCP) Ur S NONE DETECTED NONE DETECTED   Cannabinoid 50 Ng, Ur [place] POSITIVE (A) NONE DETECTED   Barbiturates, Ur Screen NONE DETECTED NONE DETECTED   Benzodiazepine, Ur Scrn NONE DETECTED NONE DETECTED   Methadone Scn, Ur NONE DETECTED NONE DETECTED    Comment: (NOTE) 383  Tricyclics, urine               Cutoff 1000 ng/mL 200  Amphetamines, urine             Cutoff 1000 ng/mL 300  MDMA (Ecstasy), urine           Cutoff 500 ng/mL 400  Cocaine Metabolite, urine       Cutoff 300 ng/mL 500  Opiate, urine                   Cutoff 300 ng/mL 600  Phencyclidine (PCP), urine      Cutoff 25 ng/mL 700  Cannabinoid, urine              Cutoff 50 ng/mL 800  Barbiturates, urine             Cutoff 200 ng/mL 900  Benzodiazepine, urine           Cutoff 200 ng/mL 1000 Methadone, urine                Cutoff 300 ng/mL 1100 1200 The urine drug screen provides only a preliminary, unconfirmed 1300 analytical test result and should not be used for non-medical 1400 purposes. Clinical consideration and professional judgment should 1500 be applied to any positive drug screen result due to possible 1600 interfering substances. A more specific alternate chemical method 1700 must be used in order to obtain a confirmed analytical result.  1800 Gas chromato graphy / mass spectrometry (GC/MS) is the preferred 1900 confirmatory method.   Pregnancy, urine POC     Status:  None   Collection Time: 07/04/16  7:45 PM  Result Value Ref Range   Preg Test, Ur NEGATIVE NEGATIVE    Comment:        THE SENSITIVITY OF THIS METHODOLOGY IS >24 mIU/mL     Current Facility-Administered  Medications  Medication Dose Route Frequency Provider Last Rate Last Dose  . FLUoxetine (PROZAC) capsule 10 mg  10 mg Oral Daily Harvest Dark, MD   10 mg at 07/05/16 1020  . traZODone (DESYREL) tablet 100 mg  100 mg Oral QHS Harvest Dark, MD   100 mg at 07/04/16 2308   Current Outpatient Prescriptions  Medication Sig Dispense Refill  . FLUoxetine (PROZAC) 10 MG capsule Take 1 capsule (10 mg total) by mouth daily. 30 capsule 0  . traZODone (DESYREL) 100 MG tablet Take 1 tablet (100 mg total) by mouth at bedtime as needed for sleep. 30 tablet 0    Musculoskeletal: Strength & Muscle Tone: within normal limits Gait & Station: normal Patient leans: N/A  Psychiatric Specialty Exam: Physical Exam  Nursing note and vitals reviewed. Constitutional: She appears well-developed and well-nourished.  HENT:  Head: Normocephalic and atraumatic.  Eyes: Conjunctivae are normal. Pupils are equal, round, and reactive to light.  Neck: Normal range of motion.  Cardiovascular: Regular rhythm and normal heart sounds.   Respiratory: Effort normal and breath sounds normal.  GI: Soft.  Musculoskeletal: Normal range of motion.  Neurological: She is alert.  Skin: Skin is warm and dry.  Psychiatric: She has a normal mood and affect. Her speech is normal and behavior is normal. Thought content normal. Cognition and memory are normal. She expresses impulsivity.    Review of Systems  Constitutional: Negative.   HENT: Negative.   Eyes: Negative.   Respiratory: Negative.   Cardiovascular: Negative.   Gastrointestinal: Negative.   Musculoskeletal: Negative.   Skin: Negative.   Neurological: Negative.   Psychiatric/Behavioral: Positive for substance abuse. Negative for depression, hallucinations, memory loss and suicidal ideas. The patient is not nervous/anxious and does not have insomnia.     Blood pressure 126/69, pulse 71, temperature 98.6 F (37 C), temperature source Oral, resp. rate 18,  height 5' 5"  (1.651 m), weight (!) 141.1 kg (311 lb), last menstrual period 06/25/2016, SpO2 97 %.Body mass index is 51.75 kg/m.  General Appearance: Disheveled  Eye Contact:  Fair  Speech:  Clear and Coherent  Volume:  Normal  Mood:  Anxious  Affect:  Congruent  Thought Process:  Goal Directed  Orientation:  Full (Time, Place, and Person)  Thought Content:  Logical  Suicidal Thoughts:  No  Homicidal Thoughts:  No  Memory:  Immediate;   Good Recent;   Good Remote;   Fair  Judgement:  Intact  Insight:  Fair  Psychomotor Activity:  Normal  Concentration:  Concentration: Fair  Recall:  [place] of Knowledge:  Poor  Language:  Poor  Akathisia:  No  Handed:  Right  AIMS (if indicated):     Assets:  Communication Skills Desire for Improvement Financial Resources/Insurance Housing Resilience Social Support  ADL's:  Intact  Cognition:  WNL  Sleep:        Treatment Plan Summary: Plan 20 year old woman with chronic mood instability. Drank a lot of liquor and took a minor overdose without any suicidal intent. Affect euthymic. No sign of acute suicidality or psychosis. Does not meet commitment criteria. 39 has appropriate outpatient treatment. Case will be reviewed with emergency room physician but the patient can be taken  off her amendment paperwork and discharge back to the community to follow-up with outpatient treatment as usual. Supportive counseling and education completed. No change to medicine.  Disposition: Patient does not meet criteria for psychiatric inpatient admission.  Alethia Berthold, MD 07/05/2016 1:47 PM

## 2016-07-05 NOTE — ED Notes (Signed)
Breakfast placed in room. 

## 2016-07-05 NOTE — ED Provider Notes (Signed)
I spoke with Dr. Toni Amendlapacs, consulting psychiatrist, who evaluated this patient and replaced her IVC commitment paperwork. He the patient have discussed outpatient follow-up.  I completed discharge paperwork.   Governor Rooksebecca Yordin Rhoda, MD 07/05/16 1440

## 2016-07-05 NOTE — Discharge Instructions (Signed)
You were evaluated by psychiatrist Dr. Toni Amendlapacs and he discussed with you discharged from the emergency department and follow-up with mental health provider.  Return to the emergency department for any worsening condition including total morning to hurt herself or others or any other symptoms concerning to you.

## 2016-07-24 ENCOUNTER — Ambulatory Visit: Payer: Medicaid Other | Admitting: Pharmacy Technician

## 2016-07-24 DIAGNOSIS — Z79899 Other long term (current) drug therapy: Secondary | ICD-10-CM

## 2016-07-25 NOTE — Progress Notes (Signed)
Met with patient completed financial assistance application for [facility] due to recent hospitalization.  Patient agreed to be responsible for gathering financial information and forwarding to appropriate department in [place].    Completed [facility] application and contract.  Patient agreed to all terms of the [facility] contract.  Provided patient with community resource material based on her particular needs.    Patient to see Dr. Litz on 07/25/16.  Patient instructed to have Dr. Litz send prescriptions [facility] to [facility].   J.  Care Manager [facility]  

## 2016-10-09 ENCOUNTER — Ambulatory Visit: Payer: Self-pay | Admitting: Family Medicine

## 2016-10-26 ENCOUNTER — Ambulatory Visit: Payer: Self-pay | Admitting: Family Medicine

## 2016-11-02 ENCOUNTER — Ambulatory Visit: Payer: Medicaid Other | Attending: Family Medicine | Admitting: Family Medicine

## 2016-11-02 ENCOUNTER — Encounter: Payer: Self-pay | Admitting: Family Medicine

## 2016-11-02 VITALS — BP 125/80 | HR 85 | Temp 98.0°F | Resp 18 | Ht 65.0 in | Wt 337.2 lb

## 2016-11-02 DIAGNOSIS — I10 Essential (primary) hypertension: Secondary | ICD-10-CM | POA: Insufficient documentation

## 2016-11-02 DIAGNOSIS — Z5189 Encounter for other specified aftercare: Secondary | ICD-10-CM | POA: Diagnosis present

## 2016-11-02 DIAGNOSIS — F17210 Nicotine dependence, cigarettes, uncomplicated: Secondary | ICD-10-CM | POA: Insufficient documentation

## 2016-11-02 DIAGNOSIS — Z79899 Other long term (current) drug therapy: Secondary | ICD-10-CM | POA: Insufficient documentation

## 2016-11-02 DIAGNOSIS — Z09 Encounter for follow-up examination after completed treatment for conditions other than malignant neoplasm: Secondary | ICD-10-CM | POA: Diagnosis not present

## 2016-11-02 NOTE — Patient Instructions (Addendum)
Schedule physical appointment in 2 weeks.   Exercising to Stay Healthy Introduction Exercising regularly is important. It has many health benefits, such as:  Improving your overall fitness, flexibility, and endurance.  Increasing your bone density.  Helping with weight control.  Decreasing your body fat.  Increasing your muscle strength.  Reducing stress and tension.  Improving your overall health. In order to become healthy and stay healthy, it is recommended that you do moderate-intensity and vigorous-intensity exercise. You can tell that you are exercising at a moderate intensity if you have a higher heart rate and faster breathing, but you are still able to hold a conversation. You can tell that you are exercising at a vigorous intensity if you are breathing much harder and faster and cannot hold a conversation while exercising. How often should I exercise? Choose an activity that you enjoy and set realistic goals. Your health care provider can help you to make an activity plan that works for you. Exercise regularly as directed by your health care provider. This may include:  Doing resistance training twice each week, such as:  Push-ups.  Sit-ups.  Lifting weights.  Using resistance bands.  Doing a given intensity of exercise for a given amount of time. Choose from these options:  150 minutes of moderate-intensity exercise every week.  75 minutes of vigorous-intensity exercise every week.  A mix of moderate-intensity and vigorous-intensity exercise every week. Children, pregnant women, people who are out of shape, people who are overweight, and older adults may need to consult a health care provider for individual recommendations. If you have any sort of medical condition, be sure to consult your health care provider before starting a new exercise program. What are some exercise ideas? Some moderate-intensity exercise ideas include:  Walking at a rate of 1 mile in 15  minutes.  Biking.  Hiking.  Golfing.  Dancing. Some vigorous-intensity exercise ideas include:  Walking at a rate of at least 4.5 miles per hour.  Jogging or running at a rate of 5 miles per hour.  Biking at a rate of at least 10 miles per hour.  Lap swimming.  Roller-skating or in-line skating.  Cross-country skiing.  Vigorous competitive sports, such as football, basketball, and soccer.  Jumping rope.  Aerobic dancing. What are some everyday activities that can help me to get exercise?  Yard work, such as:  Child psychotherapist.  Raking and bagging leaves.  Washing and waxing your car.  Pushing a stroller.  Shoveling snow.  Gardening.  Washing windows or floors. How can I be more active in my day-to-day activities?  Use the stairs instead of the elevator.  Take a walk during your lunch break.  If you drive, park your car farther away from work or school.  If you take public transportation, get off one stop early and walk the rest of the way.  Make all of your phone calls while standing up and walking around.  Get up, stretch, and walk around every 30 minutes throughout the day. What guidelines should I follow while exercising?  Do not exercise so much that you hurt yourself, feel dizzy, or get very short of breath.  Consult your health care provider before starting a new exercise program.  Wear comfortable clothes and shoes with good support.  Drink plenty of water while you exercise to prevent dehydration or heat stroke. Body water is lost during exercise and must be replaced.  Work out until you breathe faster and your heart beats  faster. This information is not intended to replace advice given to you by your health care provider. Make sure you discuss any questions you have with your health care provider. Document Released: 10/14/2010 Document Revised: 02/17/2016 Document Reviewed: 02/12/2014  2017 Elsevier

## 2016-11-02 NOTE — Progress Notes (Signed)
   Subjective:  Patient ID: Denise Gibbs, female    DOB: 05/06/1996  Age: 21 y.o. MRN: [id number]  CC: Establish Care   HPI Adelfa KohLaura Leanne Rosenzweig presents to establish care and follow-up for elevated blood pressure. She reports history of high blood pressure 1 month ago at the [facility]. She reports being on oral contraceptives at the time but it was discontinued due to elevated blood pressure. She is a current half a pack a day smoker. She is not ready to quit at this time. She also reports history of psychiatric disorder and substance abuse in the past. Reports currently being [place] by [place] [facility]. She denies any thoughts of harming herself or others.   Family History  Problem Relation Age of Onset  . Hypertension Father   . Hypertension Paternal Uncle   . Hypertension Paternal Grandfather      Outpatient Medications Prior to Visit  Medication Sig Dispense Refill  . FLUoxetine (PROZAC) 10 MG capsule Take 1 capsule (10 mg total) by mouth daily. 30 capsule 0  . traZODone (DESYREL) 100 MG tablet Take 1 tablet (100 mg total) by mouth at bedtime as needed for sleep. 30 tablet 0   No facility-administered medications prior to visit.     ROS Review of Systems  Respiratory: Negative.   Cardiovascular: Negative.   Gastrointestinal: Negative.   Psychiatric/Behavioral:       History of depression.      Objective:  BP 125/80 (BP Location: Right Arm, Patient Position: Sitting, Cuff Size: Large)   Pulse 85   Temp 98 F (36.7 C) (Oral)   Resp 18   Ht 5\' 5"  (1.651 m)   Wt (!) 337 lb 3.2 oz (153 kg)   LMP 10/16/2016   SpO2 96%   BMI 56.11 kg/m   BP/Weight 11/02/2016 07/05/2016 07/04/2016  Systolic BP 125 117 -  Diastolic BP 80 73 -  Wt. (Lbs) 337.2 - 311  BMI 56.11 - 51.75  Some encounter information is confidential and restricted. Go to Review Flowsheets activity to see all data.     Physical Exam  Cardiovascular: Normal  rate, regular rhythm, normal heart sounds and intact distal pulses.   Pulmonary/Chest: Effort normal and breath sounds normal.  Abdominal: Soft. Bowel sounds are normal.  Psychiatric: Her speech is normal. Her affect is blunt. She expresses no suicidal plans and no homicidal plans.  Nursing note and vitals reviewed.    Assessment & Plan:   Problem List Items Addressed This Visit    None    Visit Diagnoses    -Encouraged to come back to BP check and schedule physical in 2 weeks.    Follow up    -  Primary       Follow-up: Return in about 2 weeks (around 11/16/2016) for Physical.   Lizbeth BarkMandesia R Maelani Yarbro FNP

## 2016-11-02 NOTE — Progress Notes (Signed)
Patient is here to establish care   Patient declined the flu vaccine today.  Patient has taken medication today. Patient has eaten today.  Patient denies pain at this time.  Patient denies any suicidal ideations at this time.

## 2016-11-04 ENCOUNTER — Encounter: Payer: Self-pay | Admitting: Family Medicine

## 2016-12-04 ENCOUNTER — Ambulatory Visit: Payer: Self-pay | Admitting: Family Medicine

## 2017-03-29 ENCOUNTER — Telehealth: Payer: Self-pay | Admitting: Pharmacy Technician

## 2017-03-29 NOTE — Telephone Encounter (Signed)
Patient eligible to receive medication assistance at [facility] until 12/24/17 as long as eligibility requirements continue to be met.  [place] [facility]

## 2018-04-29 ENCOUNTER — Telehealth: Payer: Self-pay | Admitting: Pharmacy Technician

## 2018-04-29 NOTE — Telephone Encounter (Signed)
Received updated proof of income.  Patient eligible to receive medication assistance at [facility] as long as eligibility requirements continue to be met.  Denise Gibbs J. Tyron Manetta Care Manager [facility] 

## 2018-05-11 DIAGNOSIS — F419 Anxiety disorder, unspecified: Secondary | ICD-10-CM | POA: Insufficient documentation

## 2019-07-01 ENCOUNTER — Telehealth: Payer: Self-pay | Admitting: Pharmacy Technician

## 2019-07-01 NOTE — Telephone Encounter (Signed)
Received 2020 proof of income.  Patient eligible to receive medication assistance at [facility] as long as eligibility requirements continue to be met.  [place] [facility]

## 2020-01-28 ENCOUNTER — Telehealth: Payer: Self-pay | Admitting: Pharmacy Technician

## 2020-01-28 NOTE — Telephone Encounter (Signed)
Received updated proof of income.  Patient eligible to receive medication assistance at [facility] until time for re-certification in 9359, and as long as eligibility requirements continue to be met.  [place] [facility]

## 2020-04-12 ENCOUNTER — Other Ambulatory Visit: Payer: Self-pay

## 2020-06-14 ENCOUNTER — Other Ambulatory Visit: Payer: Self-pay

## 2020-08-02 ENCOUNTER — Other Ambulatory Visit: Payer: Self-pay

## 2020-10-04 ENCOUNTER — Other Ambulatory Visit: Payer: Self-pay

## 2020-10-06 ENCOUNTER — Other Ambulatory Visit: Payer: Medicaid Other

## 2020-10-06 NOTE — Progress Notes (Unsigned)
  [facility] Visit Note  Patient: Denise Gibbs MRN: [id number] Date of Birth: [birth year] PCP: No primary care provider on file.   Denise Gibbs 25 y.o. female presents for a telephone visit for medication management today.  There were no vitals taken for this visit.  Patient Information   Past Medical History:  Diagnosis Date  . Anxiety   . Depression       Past Surgical History:  Procedure Laterality Date  . TONSILLECTOMY       Family History  Problem Relation Age of Onset  . Hypertension Father   . Hypertension Paternal Uncle   . Hypertension Paternal Grandfather     New Diagnoses (since last visit):   Family Support: {Family Support:[id number]}  Lifestyle Diet: Breakfast:*** Lunch:*** Dinner:*** Drinks:***            Social History   Substance and Sexual Activity  Alcohol Use Yes      Social History   Tobacco Use  Smoking Status Current Every Day Smoker  . Packs/day: 0.50  . Types: Cigarettes  Smokeless Tobacco Never Used      Health Maintenance  Topic Date Due  . Hepatitis C Screening  Never done  . COVID-19 Vaccine (1) Never done  . HIV Screening  Never done  . TETANUS/TDAP  Never done  . PAP-Cervical Cytology Screening  Never done  . PAP SMEAR-Modifier  09/05/2019  . INFLUENZA VACCINE  04/25/2020   Health Maintenance/Date Completed  Last ED visit: *** Last Visit to PCP: *** Next Visit to PCP: *** Specialist Visit: *** Dental Exam: *** Eye Exam: *** Prostate Exam: *** Pelvic/PAP Exam: *** Mammogram: *** DEXA: *** Colonoscopy: *** Flu Vaccine: *** Pneumonia Vaccine: *** COVID-19 Vaccine: *** Shingrix Vaccine: ***   Assessment and Plan:

## 2021-01-11 ENCOUNTER — Other Ambulatory Visit: Payer: Self-pay

## 2021-01-11 MED ORDER — INVEGA SUSTENNA 156 MG/ML IM SUSY
PREFILLED_SYRINGE | INTRAMUSCULAR | 1 refills | Status: DC
Start: 2021-01-11 — End: 2021-03-15
  Filled 2021-01-12: qty 1, 28d supply, fill #0
  Filled 2021-02-04: qty 1, 30d supply, fill #0

## 2021-01-12 ENCOUNTER — Other Ambulatory Visit: Payer: Self-pay

## 2021-01-14 ENCOUNTER — Other Ambulatory Visit: Payer: Self-pay

## 2021-01-14 MED FILL — Mirtazapine Tab 30 MG: ORAL | 30 days supply | Qty: 30 | Fill #0 | Status: AC

## 2021-01-14 MED FILL — Bupropion HCl Tab ER 24HR 150 MG: ORAL | 30 days supply | Qty: 30 | Fill #0 | Status: AC

## 2021-01-14 MED FILL — Propranolol HCl Tab 20 MG: ORAL | 30 days supply | Qty: 60 | Fill #0 | Status: AC

## 2021-01-14 MED FILL — Gabapentin Tab 600 MG: ORAL | 30 days supply | Qty: 120 | Fill #0 | Status: AC

## 2021-01-17 ENCOUNTER — Other Ambulatory Visit: Payer: Self-pay

## 2021-01-17 ENCOUNTER — Telehealth: Payer: Self-pay | Admitting: Pharmacist

## 2021-01-17 NOTE — Telephone Encounter (Signed)
Patient [facility] eligible till 01/23/2022.

## 2021-01-21 ENCOUNTER — Other Ambulatory Visit: Payer: Self-pay

## 2021-01-24 ENCOUNTER — Other Ambulatory Visit: Payer: Self-pay

## 2021-01-24 MED ORDER — QUETIAPINE FUMARATE 100 MG PO TABS
ORAL_TABLET | ORAL | 0 refills | Status: DC
  Filled 2021-01-24: qty 30, 30d supply, fill #0

## 2021-01-25 ENCOUNTER — Other Ambulatory Visit: Payer: Self-pay

## 2021-02-04 ENCOUNTER — Other Ambulatory Visit: Payer: Self-pay

## 2021-02-07 ENCOUNTER — Other Ambulatory Visit: Payer: Self-pay

## 2021-02-10 ENCOUNTER — Other Ambulatory Visit: Payer: Self-pay

## 2021-02-10 MED ORDER — PROPRANOLOL HCL 20 MG PO TABS
ORAL_TABLET | ORAL | 2 refills | Status: DC
  Filled 2021-02-10: qty 60, 30d supply, fill #0
  Filled 2021-03-14: qty 60, 30d supply, fill #1

## 2021-02-10 MED ORDER — BUPROPION HCL ER (XL) 150 MG PO TB24
ORAL_TABLET | ORAL | 2 refills | Status: DC
  Filled 2021-02-10: qty 30, 30d supply, fill #0
  Filled 2021-03-14: qty 30, 30d supply, fill #1

## 2021-02-10 MED ORDER — INVEGA SUSTENNA 156 MG/ML IM SUSY
PREFILLED_SYRINGE | INTRAMUSCULAR | 0 refills | Status: DC
  Filled 2021-02-10 – 2021-03-07 (×2): qty 1, 30d supply, fill #0

## 2021-02-10 MED ORDER — GABAPENTIN 600 MG PO TABS
ORAL_TABLET | ORAL | 2 refills | Status: DC
  Filled 2021-02-10: qty 120, 30d supply, fill #0
  Filled 2021-03-14: qty 120, 30d supply, fill #1

## 2021-02-10 MED ORDER — MIRTAZAPINE 30 MG PO TABS
ORAL_TABLET | ORAL | 2 refills | Status: DC
  Filled 2021-02-10: qty 30, 30d supply, fill #0
  Filled 2021-03-14: qty 30, 30d supply, fill #1

## 2021-02-11 ENCOUNTER — Other Ambulatory Visit: Payer: Self-pay

## 2021-03-07 ENCOUNTER — Other Ambulatory Visit: Payer: Self-pay

## 2021-03-14 ENCOUNTER — Other Ambulatory Visit: Payer: Self-pay

## 2021-03-15 ENCOUNTER — Other Ambulatory Visit: Payer: Self-pay

## 2021-03-15 MED ORDER — GABAPENTIN 600 MG PO TABS
ORAL_TABLET | ORAL | 2 refills | Status: DC
  Filled 2021-03-15 – 2021-04-13 (×2): qty 120, 30d supply, fill #0
  Filled 2021-05-13: qty 120, 30d supply, fill #1

## 2021-03-15 MED ORDER — PROPRANOLOL HCL 20 MG PO TABS
ORAL_TABLET | ORAL | 2 refills | Status: DC
  Filled 2021-03-15 – 2021-04-13 (×2): qty 60, 30d supply, fill #0
  Filled 2021-05-13: qty 60, 30d supply, fill #1

## 2021-03-15 MED ORDER — BUPROPION HCL ER (XL) 150 MG PO TB24
ORAL_TABLET | ORAL | 2 refills | Status: DC
  Filled 2021-03-15 – 2021-04-13 (×2): qty 30, 30d supply, fill #0
  Filled 2021-05-13: qty 30, 30d supply, fill #1

## 2021-03-15 MED ORDER — QUETIAPINE FUMARATE 100 MG PO TABS
ORAL_TABLET | ORAL | 2 refills | Status: DC
  Filled 2021-03-15: qty 30, 30d supply, fill #0
  Filled 2021-04-13: qty 30, 30d supply, fill #1
  Filled 2021-05-13: qty 30, 30d supply, fill #2

## 2021-03-17 ENCOUNTER — Other Ambulatory Visit: Payer: Self-pay

## 2021-03-23 ENCOUNTER — Other Ambulatory Visit: Payer: Self-pay

## 2021-04-04 ENCOUNTER — Other Ambulatory Visit: Payer: Self-pay

## 2021-04-13 ENCOUNTER — Other Ambulatory Visit: Payer: Self-pay

## 2021-04-13 MED ORDER — INVEGA SUSTENNA 156 MG/ML IM SUSY
PREFILLED_SYRINGE | INTRAMUSCULAR | 12 refills | Status: DC
  Filled 2021-04-13: qty 1, 30d supply, fill #0
  Filled 2021-05-13: qty 1, 30d supply, fill #1

## 2021-04-13 MED ORDER — INVEGA SUSTENNA 156 MG/ML IM SUSY: PREFILLED_SYRINGE | INTRAMUSCULAR | 0 refills | Status: DC

## 2021-04-13 MED FILL — Mirtazapine Tab 30 MG: ORAL | 30 days supply | Qty: 30 | Fill #1 | Status: AC

## 2021-04-28 ENCOUNTER — Other Ambulatory Visit: Payer: Self-pay

## 2021-05-13 ENCOUNTER — Other Ambulatory Visit: Payer: Self-pay

## 2021-05-13 MED FILL — Mirtazapine Tab 30 MG: ORAL | 30 days supply | Qty: 30 | Fill #2 | Status: AC

## 2021-05-16 ENCOUNTER — Other Ambulatory Visit: Payer: Self-pay

## 2021-05-16 MED ORDER — QUETIAPINE FUMARATE 100 MG PO TABS
ORAL_TABLET | ORAL | 2 refills | Status: DC
  Filled 2021-06-13: qty 30, 30d supply, fill #0

## 2021-05-16 MED ORDER — PROPRANOLOL HCL 20 MG PO TABS
ORAL_TABLET | ORAL | 2 refills | Status: DC
  Filled 2021-06-13: qty 60, 30d supply, fill #0

## 2021-05-16 MED ORDER — BUPROPION HCL ER (XL) 150 MG PO TB24
ORAL_TABLET | ORAL | 2 refills | Status: DC
  Filled 2021-06-13: qty 30, 30d supply, fill #0

## 2021-05-16 MED ORDER — INVEGA SUSTENNA 156 MG/ML IM SUSY
PREFILLED_SYRINGE | INTRAMUSCULAR | 2 refills | Status: DC
  Filled 2021-06-13: qty 1, 30d supply, fill #0
  Filled 2021-07-13: qty 1, 30d supply, fill #1
  Filled 2021-08-15: qty 1, 30d supply, fill #2
  Filled ????-??-??: fill #1

## 2021-05-16 MED ORDER — GABAPENTIN 600 MG PO TABS
ORAL_TABLET | ORAL | 2 refills | Status: DC
  Filled 2021-06-13: qty 120, 30d supply, fill #0

## 2021-05-26 ENCOUNTER — Other Ambulatory Visit: Payer: Self-pay

## 2021-06-02 ENCOUNTER — Other Ambulatory Visit: Payer: Self-pay

## 2021-06-07 ENCOUNTER — Other Ambulatory Visit: Payer: Self-pay

## 2021-06-13 ENCOUNTER — Other Ambulatory Visit: Payer: Self-pay

## 2021-06-23 ENCOUNTER — Other Ambulatory Visit: Payer: Self-pay

## 2021-06-29 ENCOUNTER — Other Ambulatory Visit: Payer: Self-pay

## 2021-07-04 ENCOUNTER — Other Ambulatory Visit: Payer: Self-pay

## 2021-07-04 MED ORDER — PROPRANOLOL HCL 20 MG PO TABS
ORAL_TABLET | ORAL | 2 refills | Status: DC
  Filled 2021-07-13: qty 60, 30d supply, fill #0
  Filled 2021-08-15: qty 60, 30d supply, fill #1

## 2021-07-04 MED ORDER — INVEGA SUSTENNA 234 MG/1.5ML IM SUSY
PREFILLED_SYRINGE | INTRAMUSCULAR | 2 refills | Status: DC
  Filled 2021-08-15: qty 1.5, fill #0
  Filled 2021-08-23: qty 1.5, 30d supply, fill #0

## 2021-07-04 MED ORDER — GABAPENTIN 600 MG PO TABS
ORAL_TABLET | ORAL | 2 refills | Status: DC
  Filled 2021-07-13: qty 120, 30d supply, fill #0
  Filled 2021-08-15: qty 120, 30d supply, fill #1

## 2021-07-04 MED ORDER — BUPROPION HCL ER (XL) 150 MG PO TB24
ORAL_TABLET | ORAL | 2 refills | Status: DC
  Filled 2021-07-13: qty 30, 30d supply, fill #0
  Filled 2021-08-15 – 2021-08-23 (×2): qty 30, 30d supply, fill #1

## 2021-07-04 MED ORDER — QUETIAPINE FUMARATE 100 MG PO TABS
ORAL_TABLET | ORAL | 2 refills | Status: DC
  Filled 2021-07-13: qty 30, 30d supply, fill #0
  Filled 2021-08-15: qty 30, 30d supply, fill #1

## 2021-07-13 ENCOUNTER — Other Ambulatory Visit: Payer: Self-pay

## 2021-07-20 ENCOUNTER — Other Ambulatory Visit: Payer: Self-pay

## 2021-07-20 MED ORDER — INVEGA SUSTENNA 156 MG/ML IM SUSY: PREFILLED_SYRINGE | INTRAMUSCULAR | 2 refills | Status: DC

## 2021-07-25 ENCOUNTER — Other Ambulatory Visit: Payer: Self-pay

## 2021-07-30 DIAGNOSIS — F329 Major depressive disorder, single episode, unspecified: Secondary | ICD-10-CM | POA: Insufficient documentation

## 2021-07-30 DIAGNOSIS — F209 Schizophrenia, unspecified: Secondary | ICD-10-CM | POA: Diagnosis present

## 2021-08-04 DIAGNOSIS — I10 Essential (primary) hypertension: Secondary | ICD-10-CM | POA: Insufficient documentation

## 2021-08-15 ENCOUNTER — Other Ambulatory Visit: Payer: Self-pay

## 2021-08-23 ENCOUNTER — Other Ambulatory Visit: Payer: Self-pay

## 2021-09-03 DIAGNOSIS — D649 Anemia, unspecified: Secondary | ICD-10-CM | POA: Insufficient documentation

## 2021-09-14 ENCOUNTER — Other Ambulatory Visit: Payer: Self-pay

## 2021-09-21 ENCOUNTER — Other Ambulatory Visit: Payer: Self-pay

## 2021-09-22 ENCOUNTER — Other Ambulatory Visit: Payer: Self-pay

## 2021-09-23 ENCOUNTER — Other Ambulatory Visit: Payer: Self-pay

## 2021-09-27 ENCOUNTER — Other Ambulatory Visit: Payer: Self-pay

## 2021-09-28 ENCOUNTER — Other Ambulatory Visit: Payer: Self-pay

## 2021-10-19 ENCOUNTER — Other Ambulatory Visit: Payer: Self-pay

## 2021-10-20 ENCOUNTER — Other Ambulatory Visit: Payer: Self-pay

## 2021-10-26 DIAGNOSIS — G629 Polyneuropathy, unspecified: Secondary | ICD-10-CM | POA: Insufficient documentation

## 2021-11-09 ENCOUNTER — Other Ambulatory Visit: Payer: Self-pay

## 2021-11-09 ENCOUNTER — Emergency Department: Payer: Medicaid Other

## 2021-11-09 ENCOUNTER — Encounter: Payer: Self-pay | Admitting: Emergency Medicine

## 2021-11-09 ENCOUNTER — Emergency Department
Admission: EM | Admit: 2021-11-09 | Discharge: 2021-11-09 | Disposition: A | Discharge status: Home / Self Care | Payer: Medicaid Other | Attending: Emergency Medicine | Admitting: Emergency Medicine

## 2021-11-09 DIAGNOSIS — R0603 Acute respiratory distress: Secondary | ICD-10-CM | POA: Diagnosis not present

## 2021-11-09 DIAGNOSIS — R531 Weakness: Secondary | ICD-10-CM | POA: Diagnosis not present

## 2021-11-09 DIAGNOSIS — Z139 Encounter for screening, unspecified: Secondary | ICD-10-CM | POA: Insufficient documentation

## 2021-11-09 DIAGNOSIS — R0602 Shortness of breath: Secondary | ICD-10-CM | POA: Insufficient documentation

## 2021-11-09 DIAGNOSIS — R0600 Dyspnea, unspecified: Secondary | ICD-10-CM | POA: Diagnosis not present

## 2021-11-09 LAB — BASIC METABOLIC PANEL
Anion gap: 4 — ABNORMAL LOW (ref 5–15)
BUN: 9 mg/dL (ref 6–20)
CO2: 29 mmol/L (ref 22–32)
Calcium: 8.9 mg/dL (ref 8.9–10.3)
Chloride: 105 mmol/L (ref 98–111)
Creatinine, Ser: 0.53 mg/dL (ref 0.44–1.00)
GFR, Estimated: >60 mL/min (ref >60)
Glucose, Bld: 117 mg/dL — ABNORMAL HIGH (ref 70–99)
Potassium: 3.6 mmol/L (ref 3.5–5.1)
Sodium: 138 mmol/L (ref 135–145)

## 2021-11-09 LAB — CBC WITH DIFFERENTIAL/PLATELET
Abs Immature Granulocytes: 0.06 10*3/uL (ref 0.00–0.07)
Basophils Absolute: 0 10*3/uL (ref 0.0–0.1)
Basophils Relative: 0 %
Eosinophils Absolute: 0.2 10*3/uL (ref 0.0–0.5)
Eosinophils Relative: 2 %
HCT: 30.8 % — ABNORMAL LOW (ref 36.0–46.0)
Hemoglobin: 9.8 g/dL — ABNORMAL LOW (ref 12.0–15.0)
Immature Granulocytes: 1 %
Lymphocytes Relative: 17 %
Lymphs Abs: 1.7 10*3/uL (ref 0.7–4.0)
MCH: 30.2 pg (ref 26.0–34.0)
MCHC: 31.8 g/dL (ref 30.0–36.0)
MCV: 94.8 fL (ref 80.0–100.0)
Monocytes Absolute: 0.7 10*3/uL (ref 0.1–1.0)
Monocytes Relative: 7 %
Neutro Abs: 7 10*3/uL (ref 1.7–7.7)
Neutrophils Relative %: 73 %
Platelets: 256 10*3/uL (ref 150–400)
RBC: 3.25 MIL/uL — ABNORMAL LOW (ref 3.87–5.11)
RDW: 14.1 % (ref 11.5–15.5)
WBC: 9.5 10*3/uL (ref 4.0–10.5)
nRBC: 0 % (ref 0.0–0.2)

## 2021-11-09 NOTE — ED Provider Notes (Signed)
[facility] Provider Note    Event Date/Time   First MD Initiated Contact with Patient 11/09/21 0010     (approximate)   History   Respiratory Distress   HPI  Denise Gibbs is a 26 y.o. female who presents to the ED for evaluation of Respiratory Distress   I review dc summary from [place] [facility] from 1/26.  Patient is morbidly obese with BMI of 60, borderline personality disorder, depression and anxiety.  She was admitted for hypoxic respiratory failure in the setting of haemophilus influenza, flu a and multifocal pneumonia.  Denise Gibbs was placed, and then decannulized on 12/30.  Sent to rehab on room air, and then returned home just 5 days ago.  Staying at home with her father and fianc, ambulatory with a walker only.  Chronic LLE foot drop and weakness with orthotic boot.  Patient presents to the ED via EMS from home for evaluation of dyspnea.  She reports that since the trach was removed, she has not been able to sleep at night and is scared that she will stop breathing at night.  She reports tonight that she was "just tired of it."  EMS found her with sats of 89%, placed her on NRB and transported to the ED.  Sitting up in the room, she reports feeling better now.  She reports that she does not have a PCP, but she can call someone tomorrow.  She says the sleep center never called her for sleep study follow-up, and does not have a number to call.  Her fianc later arrives to the bedside and provides some supplemental history.  He questions if her kidney function is better or if "her insides need more time to regenerate."  He reports it is scary seeing her sleep at night and seems like she has difficulty breathing with sleeping.   Physical Exam   Triage Vital Signs: ED Triage Vitals  Enc Vitals Group     BP 11/09/21 0006 137/61     Pulse Rate 11/09/21 0006 90     Resp 11/09/21 0006 (!) 30     Temp --      Temp src --      SpO2 11/09/21 0006 97 %      Weight 11/09/21 0010 (!) 366 lb (166 kg)     Height 11/09/21 0010 5\' 5"  (1.651 m)     Head Circumference --      Peak Flow --      Pain Score 11/09/21 0008 6     Pain Loc --      Pain Edu? --      Excl. in GC? --     Most recent vital signs: Vitals:   11/09/21 0100 11/09/21 0118  BP: 133/71   Pulse: 86 88  Resp: (!) 25 (!) 23  Temp:    SpO2: 93% 91%    General: Awake, no distress.  Morbidly obese.  Conversational.  Minimal tachypnea. CV:  Good peripheral perfusion. RRR Resp:  Minimal tachypnea to the low 20s.  No distress.  Clear lungs.  Faint inspiratory stridor on neck auscultation. Abd:  No distention.  Soft and benign MSK:  No deformity noted.  Orthotic to the right foot and ankle.  Moving all 4 extremities without difficulty. Neuro:  No focal deficits appreciated. Other:     ED Results / Procedures / Treatments   Labs (all labs ordered are listed, but only abnormal results are displayed) Labs Reviewed  CBC WITH  DIFFERENTIAL/PLATELET - Abnormal; Notable for the following components:      Result Value   RBC 3.25 (*)    Hemoglobin 9.8 (*)    HCT 30.8 (*)    All other components within normal limits  BASIC METABOLIC PANEL - Abnormal; Notable for the following components:   Glucose, Bld 117 (*)    Anion gap 4 (*)    All other components within normal limits  POC URINE PREG, ED    EKG Sinus rhythm, rate of 85 bpm.  Normal axis and intervals.  Obscured lead V1, but otherwise unremarkable without ischemic features.  RADIOLOGY 2 view CXR reviewed by me with cardiomegaly and mild pulmonary vascular congestion.  Official radiology report(s): DG Chest 2 View  Result Date: 11/09/2021 CLINICAL DATA:  Shortness of breath. EXAM: CHEST - 2 VIEW COMPARISON:  None FINDINGS: Shallow inspiration. Right lung base atelectasis. Left mid to lower lung field streaky and platelike densities may represent atelectasis but concerning for infiltrate. No large pleural effusion or  pneumothorax. Cardiomegaly with vascular congestion. No acute osseous pathology. IMPRESSION: 1. Cardiomegaly with mild vascular congestion. 2. Left mid to lower lung field atelectasis versus infiltrate. Electronically Signed   By: Elgie Collard M.D.   On: 11/09/2021 00:52    PROCEDURES and INTERVENTIONS:  .1-3 Lead EKG Interpretation Performed by: Delton Prairie, MD Authorized by: Delton Prairie, MD     Interpretation: normal     ECG rate:  80   ECG rate assessment: normal     Rhythm: sinus rhythm     Ectopy: none     Conduction: normal    Medications - No data to display   IMPRESSION / MDM / ASSESSMENT AND PLAN / ED COURSE  I reviewed the triage vital signs and the nursing notes.  Young woman with a BMI of 60 who just was discharged after a prolonged stay presents to the ED with subacute dyspnea at night, possibly degree of anxiety versus OSA primarily, and suitable for outpatient management with PCP follow-up and sleep study.  EMS reports that she was slightly hypoxic in the field and 89%, but she is quickly weaned off of NRB to nasal cannula to room air while in the ED without hypoxia.  She reports feeling better and is observed for about 1.5 hours on telemetry without acute events.  CXR with cardiomegaly but is clear, basic labs with normal metabolic panel and normocytic anemia at baseline when compared to labs over the past 1 month.  Her EKG is nonischemic and she is requesting discharge on my reassessment.  I considered observation admission for this patient with the possibility of hypoxia, but she is quite eager to go and I think outpatient management is reasonable.  I provided her with information for PCP and sleep study follow-up.  We discussed healthy sleep habits at home and appropriate return precautions for the ED, she is suitable for outpatient management.  Clinical Course as of 11/09/21 0121  Wed Nov 09, 2021  0120 Reassessed.  Remains normoxic on room air.  We discussed plan  of care.  We discussed the importance of PCP and sleep study follow-up.  We discussed return precautions. [DS]  0120 Patient is requesting discharge and says she does not want to stay. [DS]    Clinical Course User Index [DS] Delton Prairie, MD     FINAL CLINICAL IMPRESSION(S) / ED DIAGNOSES   Final diagnoses:  Shortness of breath  Encounter for medical screening examination     Rx /  DC Orders   ED Discharge Orders     None        Note:  This document was prepared using Dragon voice recognition software and may include unintentional dictation errors.   Delton Prairie, MD 11/09/21 [phone]

## 2021-11-09 NOTE — ED Notes (Addendum)
Pt discharge information reviewed. Pt understands need for follow up care and when to return if symptoms worsen. All questions answered. Pt is alert and oriented with even and regular respirations. Pt is brought out of department in wheelchair with boyfriend.

## 2021-11-09 NOTE — ED Notes (Signed)
ED Provider at bedside. 

## 2021-11-09 NOTE — ED Triage Notes (Signed)
Report per EMS.  [name] recently taken out in january. Pt c/o of difficulty breathing since tonight but has been having shortness of breathe since January. Pt just got home from hospital few days ago. Per EmS 89% on room air. Placed on 15 liters non rebreather.

## 2021-11-10 ENCOUNTER — Other Ambulatory Visit: Payer: Self-pay

## 2021-11-10 ENCOUNTER — Inpatient Hospital Stay
Admission: AD | Admit: 2021-11-10 | Payer: Medicaid Other | Source: Other Acute Inpatient Hospital | Admitting: Internal Medicine

## 2021-11-10 MED ORDER — INVEGA SUSTENNA 156 MG/ML IM SUSY: PREFILLED_SYRINGE | INTRAMUSCULAR | 2 refills | Status: DC

## 2021-11-10 MED ORDER — INVEGA SUSTENNA 234 MG/1.5ML IM SUSY: PREFILLED_SYRINGE | INTRAMUSCULAR | 0 refills | Status: DC

## 2021-11-10 NOTE — H&P (Shared)
NAME:  Denise Gibbs, MRN:  [id number], DOB:  [birth year], LOS: 0 ADMISSION DATE:  (Not on file), CONSULTATION DATE:  11/10/21 REFERRING MD:  [facility], CHIEF COMPLAINT:  SOB   History of Present Illness:  26 year old smoker with chronic anxiety, schizophrenia, BMI 61, recent complex hospitalization 11/5 through 1/26 for H flu and influenza pneumonia ARDS, pseudomonas VAP requiring tracheostomy with eventual decannulation 12/30 (see DC summary in care everywhere) who has been having breathing issues since decannulation.  She was actually just seen at [facility] ER 2/15 for this issue with severe hypoxemia that spontaneously resolved.  Seems she went from that ER to [facility] (?EMS or self ***) and was admitted for stridor.  There, she progressed to worsening respiratory distress and is now on BIPAP.  CXR at [facility] [place] revealed bilateral opacities with question of edema or atelectasis.  CT of the neck revealed a narrowing of the trachea superior to her old tracheostomy site.  She is transferred to [facility] for further management of this complex respiratory failure.  Pertinent  Medical History  Mood instability vs. Schizophrenia vs. Bipolar vs. Some combination thereof Depression Anxiety Profound obesity Prior tracheostomy Alcohol abuse Probable OHS/OSA  Significant Hospital Events: Including procedures, antibiotic start and stop dates in addition to other pertinent events   2/16 [facility] admission  Interim History / Subjective:  Admitting  Objective   There were no vitals taken for this visit.       No intake or output data in the 24 hours ending 11/10/21 2145 There were no vitals filed for this visit.  Examination: General: *** HENT: *** Lungs: *** Cardiovascular: *** Abdomen: *** Extremities: *** Neuro: *** GU: ***  Resolved Hospital Problem list   N/A  Assessment & Plan:  Recurrent hypoxemic respiratory failure after trach decannulation with hx c/w symptomatic tracheal  stenosis and de-recruitment.  Long history of severe psychiatric issues  Hx of smoking  OHS/OSA, BMI 61  - Admit to ICU - Continue BIPAP - Needs ENT evaluation  Best Practice (right click and "Reselect all SmartList Selections" daily)   Diet/type: NPO DVT prophylaxis: LMWH GI prophylaxis: H2B Lines: N/A Foley:  N/A Code Status:  full code Last date of multidisciplinary goals of care discussion [pending]  Labs   CBC: Recent Labs  Lab 11/09/21 0026  WBC 9.5  NEUTROABS 7.0  HGB 9.8*  HCT 30.8*  MCV 94.8  PLT 256    Basic Metabolic Panel: Recent Labs  Lab 11/09/21 0026  NA 138  K 3.6  CL 105  CO2 29  GLUCOSE 117*  BUN 9  CREATININE 0.53  CALCIUM 8.9   GFR: Estimated Creatinine Clearance: 169.2 mL/min (by C-G formula based on SCr of 0.53 mg/dL). Recent Labs  Lab 11/09/21 0026  WBC 9.5    Liver Function Tests: No results for input(s): AST, ALT, ALKPHOS, BILITOT, PROT, ALBUMIN in the last 168 hours. No results for input(s): LIPASE, AMYLASE in the last 168 hours. No results for input(s): AMMONIA in the last 168 hours.  ABG No results found for: PHART, PCO2ART, PO2ART, HCO3, TCO2, ACIDBASEDEF, O2SAT   Coagulation Profile: No results for input(s): INR, PROTIME in the last 168 hours.  Cardiac Enzymes: No results for input(s): CKTOTAL, CKMB, CKMBINDEX, TROPONINI in the last 168 hours.  HbA1C: No results found for: HGBA1C  CBG: No results for input(s): GLUCAP in the last 168 hours.  Review of Systems:   ***  Past Medical History:  She,  has a past  medical history of Anxiety and Depression.   Surgical History:   Past Surgical History:  Procedure Laterality Date   TONSILLECTOMY       Social History:   reports that she has been smoking cigarettes. She has been smoking an average of .5 packs per day. She has never used smokeless tobacco. She reports current alcohol use. She reports current drug use. Drug: Marijuana.   Family History:  Her  family history includes Hypertension in her father, paternal grandfather, and paternal uncle.   Allergies No Known Allergies   Home Medications  Prior to Admission medications   Medication Sig Start Date End Date Taking? Authorizing Provider  albuterol (VENTOLIN HFA) 108 (90 Base) MCG/ACT inhaler Inhale 2 puffs into the lungs every 6 (six) hours as needed. 11/03/21 12/03/21  [provider]  amLODipine (NORVASC) 10 MG tablet Take 1 tablet by mouth daily. 11/04/21 12/04/21  [provider]  calcitRIOL (ROCALTROL) 0.25 MCG capsule Take 1 capsule by mouth as directed. 10/21/21 10/21/22  [provider]  carvedilol (COREG) 25 MG tablet Take 1 tablet by mouth in the morning and at bedtime. 11/03/21 12/03/21  [provider]  clonazePAM (KLONOPIN) 0.5 MG tablet Take 0.5 tablets by mouth as needed. 11/03/21   [provider]  cloNIDine (CATAPRES) 0.1 MG tablet Take 1 tablet by mouth 3 (three) times daily. 11/03/21 12/03/21  [provider]  diclofenac Sodium (VOLTAREN) 1 % GEL Apply 1 application topically in the morning, at noon, in the evening, and at bedtime. 10/20/21 10/20/22  [provider]  famotidine (PEPCID) 20 MG tablet Take 1 tablet by mouth in the morning and at bedtime. 10/20/21 11/19/21  [provider]  ferrous sulfate 325 (65 FE) MG tablet Take 1 tablet by mouth every other day. 11/04/21 12/04/21  [provider]  fluticasone (FLONASE) 50 MCG/ACT nasal spray Place 1 spray into both nostrils daily. 10/21/21 10/21/22  [provider]  gabapentin (NEURONTIN) 300 MG capsule Take 2 capsules by mouth in the morning, at noon, in the evening, and at bedtime. 11/03/21 12/03/21  [provider]  losartan (COZAAR) 25 MG tablet Take 1 tablet by mouth daily. 11/03/21 12/03/21  [provider]  paliperidone (INVEGA SUSTENNA) 156 MG/ML SUSY injection Inject 1 milliliter (156mg  total) intramuscularly once every month. 11/09/21      paliperidone (INVEGA SUSTENNA) 234 MG/1.5ML SUSY injection INJECT 1.5ML (234MG  TOTAL) INTO THE MUSCLE ONCE. (THEN GIVE 156MG  INJECTION ONE WEEK LATER). 11/09/21     Petrolatum OINT Apply topically. 10/20/21   [provider]  potassium chloride SA (KLOR-CON M) 20 MEQ tablet Take 2 tablets by mouth daily. 11/03/21   [provider]  pseudoephedrine (SUDAFED) 30 MG tablet Take 30 mg by mouth every 8 (eight) hours as needed. 10/20/21   [provider]  risperiDONE (RISPERDAL) 1 MG tablet Take 1 mg by mouth at bedtime. 11/03/21 12/03/21  [provider]  venlafaxine (EFFEXOR) 75 MG tablet Take 1 tablet by mouth daily. 11/04/21 12/04/21  [provider]  mirtazapine (REMERON) 30 MG tablet TAKE ONE TABLET BY MOUTH AT BEDTIME 10/04/20 07/13/21    paliperidone (INVEGA) 6 MG 24 hr tablet TAKE TWO TABLETS BY MOUTH EVERY MORNING 10/04/20 05/13/21       Critical care time: ***

## 2021-11-11 DIAGNOSIS — G4733 Obstructive sleep apnea (adult) (pediatric): Secondary | ICD-10-CM | POA: Insufficient documentation

## 2021-11-11 DIAGNOSIS — J9503 Malfunction of tracheostomy stoma: Secondary | ICD-10-CM | POA: Diagnosis present

## 2021-12-05 ENCOUNTER — Other Ambulatory Visit: Payer: Self-pay

## 2021-12-09 ENCOUNTER — Other Ambulatory Visit: Payer: Self-pay

## 2021-12-27 ENCOUNTER — Other Ambulatory Visit: Payer: Self-pay

## 2021-12-27 MED ORDER — BUPROPION HCL ER (XL) 150 MG PO TB24
ORAL_TABLET | Freq: Every day | ORAL | 0 refills | Status: DC
  Filled 2021-12-27: qty 30, 30d supply, fill #0

## 2021-12-27 MED ORDER — PROPRANOLOL HCL 20 MG PO TABS
ORAL_TABLET | ORAL | 0 refills | Status: AC
  Filled 2021-12-27: qty 60, 30d supply, fill #0

## 2021-12-27 MED ORDER — GABAPENTIN 600 MG PO TABS
ORAL_TABLET | ORAL | 0 refills | Status: DC
  Filled 2021-12-27: qty 120, 30d supply, fill #0

## 2021-12-27 MED ORDER — QUETIAPINE FUMARATE 100 MG PO TABS: ORAL_TABLET | Freq: Every evening | ORAL | 0 refills | Status: AC

## 2022-01-02 ENCOUNTER — Telehealth: Payer: Self-pay | Admitting: Pharmacy Technician

## 2022-01-02 ENCOUNTER — Other Ambulatory Visit: Payer: Self-pay

## 2022-01-02 NOTE — Telephone Encounter (Signed)
Patient has Medicaid with prescription drug coverage.  No longer meets [facility]'s eligibility criteria.  Patient notified by letter. ? ?Denise Gibbs ?Care Manager ?[facility] ? ? ?[address] ?       [place], [place]  [address] ? ?January 02, 2022 ? ? ? ?Dear Denise Gibbs: ? ?This is to inform you that you are no longer eligible to receive medication assistance at [facility].  The reason(s) are:   ? ?_____Your total gross monthly household income exceeds 300% of the Federal Poverty Level.   ?_____Tangible assets (savings, checking, stocks/bonds, pension, retirement, etc.) exceeds our limit  ?_____You are eligible to receive benefits from [facility] or HIV Medication  ?          Assistance program ?_____You are eligible to receive benefits from a Medicare Part ?D? plan ?__X__You have prescription insurance with Medicaid ?_____You are not an [facility] resident ?_____Failure to provide all requested documentation (proof of income information for 2023, and/or Patient Intake Application, DOH Attestation, Contract, etc).   ? ?We regret that we are unable to help you at this time.  If your prescription coverage is terminated, please contact [facility], so that we may reassess your eligibility for our program.  If you have questions, we may be contacted at [phone]. ? ?Thank you, ? ?[facility]  ?

## 2022-05-29 ENCOUNTER — Encounter: Payer: Self-pay | Admitting: Emergency Medicine

## 2022-05-29 ENCOUNTER — Emergency Department
Admission: EM | Admit: 2022-05-29 | Discharge: 2022-05-29 | Disposition: A | Discharge status: Home / Self Care | Payer: Medicaid Other | Attending: Emergency Medicine | Admitting: Emergency Medicine

## 2022-05-29 ENCOUNTER — Other Ambulatory Visit: Payer: Self-pay

## 2022-05-29 DIAGNOSIS — T40601A Poisoning by unspecified narcotics, accidental (unintentional), initial encounter: Secondary | ICD-10-CM | POA: Insufficient documentation

## 2022-05-29 LAB — COMPREHENSIVE METABOLIC PANEL
ALT: 13 U/L (ref 0–44)
AST: 23 U/L (ref 15–41)
Albumin: 3.6 g/dL (ref 3.5–5.0)
Alkaline Phosphatase: 53 U/L (ref 38–126)
Anion gap: 9 (ref 5–15)
BUN: 13 mg/dL (ref 6–20)
CO2: 23 mmol/L (ref 22–32)
Calcium: 8.7 mg/dL — ABNORMAL LOW (ref 8.9–10.3)
Chloride: 108 mmol/L (ref 98–111)
Creatinine, Ser: 0.76 mg/dL (ref 0.44–1.00)
GFR, Estimated: >60 mL/min (ref >60)
Glucose, Bld: 148 mg/dL — ABNORMAL HIGH (ref 70–99)
Potassium: 4.1 mmol/L (ref 3.5–5.1)
Sodium: 140 mmol/L (ref 135–145)
Total Bilirubin: 0.9 mg/dL (ref 0.3–1.2)
Total Protein: 7.3 g/dL (ref 6.5–8.1)

## 2022-05-29 LAB — ETHANOL: Alcohol, Ethyl (B): <10 mg/dL (ref <10)

## 2022-05-29 LAB — SALICYLATE LEVEL: Salicylate Lvl: <7 mg/dL — ABNORMAL LOW (ref 7.0–30.0)

## 2022-05-29 LAB — ACETAMINOPHEN LEVEL: Acetaminophen (Tylenol), Serum: <10 ug/mL — ABNORMAL LOW (ref 10–30)

## 2022-05-29 MED ORDER — ONDANSETRON HCL 4 MG/2ML IJ SOLN
4.0000 mg | Freq: Once | INTRAMUSCULAR | Status: AC
  Administered 2022-05-29: 4 mg via INTRAVENOUS
  Filled 2022-05-29: qty 2

## 2022-05-29 MED ORDER — NALOXONE HCL 4 MG/0.1ML NA LIQD: 1.0000 | Freq: Once | NASAL | Status: DC | PRN

## 2022-05-29 MED ORDER — SODIUM CHLORIDE 0.9 % IV BOLUS
1000.0000 mL | Freq: Once | INTRAVENOUS | Status: AC
  Administered 2022-05-29: 1000 mL via INTRAVENOUS

## 2022-05-29 NOTE — ED Notes (Signed)
Pt. To ED for OD. Pt. States she took 1 30mg  tablet of street percocet, and took her gabapentin this AM. Pt. States she has been doing crack cocaine the last 3 days. Pt. States, "I'm such a fuck up, I keep fucking up, I hate myself, I hate all of this." Dr. at bedside asked pt. Repeatedly if she wanted to hurt her self, or kill herself, or anyone else. Pt. States she absolutely does not want to harm herself or anyone else. Pt. States she does not want resources for rehab, or treatement.

## 2022-05-29 NOTE — ED Provider Notes (Signed)
[facility] Provider Note    Event Date/Time   First MD Initiated Contact with Patient 05/29/22 1514     (approximate)   History   Drug Overdose   HPI  Denise Gibbs is a 26 y.o. female   Past medical history of anxiety, depression, substance use, tracheostomy placement for hypoxemic respiratory failure, high BMI, presents with Narcan revival from the field.  She was found by her boyfriend at home, minimally responsive, EMS administered 4 of intranasal and 2 of IV Narcan with good response.  The patient states that she took her normal dose of gabapentin this morning as well as a pill she got from the streets which she believed was a 30 mg Percocet.  She denies alcohol use today and has other coingestions.  She denies suicide attempt and denies suicidality.  She is frustrated with her substance use.  Has no other complaints at this time.  History was obtained via the patient, the father who is at bedside, and review of external medical notes.      Physical Exam   Triage Vital Signs: ED Triage Vitals  Enc Vitals Group     BP 05/29/22 1515 114/87     Pulse Rate 05/29/22 1515 (!) 103     Resp 05/29/22 1515 (!) 9     Temp 05/29/22 1527 98.1 F (36.7 C)     Temp Source 05/29/22 1517 Oral     SpO2 05/29/22 1515 93 %     Weight --      Height --      Head Circumference --      Peak Flow --      Pain Score 05/29/22 1515 0     Pain Loc --      Pain Edu? --      Excl. in GC? --     Most recent vital signs: Vitals:   05/29/22 1645 05/29/22 1700  BP:  (!) 138/94  Pulse: 86 87  Resp: 14 18  Temp:    SpO2: 92% 90%    General: Awake, no distress.  CV:  Good peripheral perfusion.  Tachycardic 100. Resp:  Normal effort.  No respiratory distress. Abd:  No distention.  Nontender. Other:  Alert and oriented, moving all extremities, no signs of trauma on my exam.   ED Results / Procedures / Treatments   Labs (all labs ordered are listed,  but only abnormal results are displayed) Labs Reviewed  ACETAMINOPHEN LEVEL - Abnormal; Notable for the following components:      Result Value   Acetaminophen (Tylenol), Serum <10 (*)    All other components within normal limits  COMPREHENSIVE METABOLIC PANEL - Abnormal; Notable for the following components:   Glucose, Bld 148 (*)    Calcium 8.7 (*)    All other components within normal limits  SALICYLATE LEVEL - Abnormal; Notable for the following components:   Salicylate Lvl <7.0 (*)    All other components within normal limits  ETHANOL  PREGNANCY, URINE     I reviewed labs and they are notable for neg tylenol and asa   EKG  ED ECG REPORT I, Pilar Jarvis, the attending physician, personally viewed and interpreted this ECG.   Date: 05/29/2022  EKG Time: 1544  Rate: 95  Rhythm: normal EKG, normal sinus rhythm  Axis: normal  Intervals:none  ST&T Change: no ischemia    PROCEDURES:  Critical Care performed: No  Procedures   MEDICATIONS ORDERED IN ED: Medications  naloxone (NARCAN) nasal spray 4 mg/0.1 mL (has no administration in time range)  ondansetron (ZOFRAN) injection 4 mg (4 mg Intravenous Given 05/29/22 1545)  sodium chloride 0.9 % bolus 1,000 mL (0 mLs Intravenous Stopped 05/29/22 1628)     IMPRESSION / MDM / ASSESSMENT AND PLAN / ED COURSE  I reviewed the triage vital signs and the nursing notes.                              Differential diagnosis includes, but is not limited to, opioid overdose, other intoxicants, considered but less likely suicide attempt.  Monitor for respiratory depression.   The patient is on the cardiac monitor to evaluate for evidence of arrhythmia and/or significant heart rate changes.  MDM: This is a patient who took a pill from the streets which she believed was an opioid and subsequently developed respiratory depression requiring Narcan revival.  Responded well to Narcan, will observe in the emergency department for several  hours after Narcan wears off, closely monitoring her respirations via O2 monitoring and capnography.  Patient appears well at this time, no other complaints, no other coingestions and denies suicide attempt.  We will check tox screen, basic labs, EKG.  Stable 3.5 hr obs post narcan. Labs wnl. Desat 90s while sleeping likely undx'd osa wakens easily and sats normalize.   Dispo: After careful consideration of this patient's presentation, medical and social risk factors, and evaluation in the emergency department I engaged in shared decision making with the patient and/or their representative to consider admission or observation and this patient was ultimately dc'd because stable 3.5hr post narcan and good family support at home w plan to call out pt resources substance use. .   Patient's presentation is most consistent with acute presentation with potential threat to life or bodily function.       FINAL CLINICAL IMPRESSION(S) / ED DIAGNOSES   Final diagnoses:  Opiate overdose, accidental or unintentional, initial encounter [facility])     Rx / DC Orders   ED Discharge Orders     None        Note:  This document was prepared using Dragon voice recognition software and may include unintentional dictation errors.    Pilar Jarvis, MD 05/29/22 [phone]

## 2022-05-29 NOTE — ED Notes (Addendum)
Dr. Modesto Charon notified that pt's O2 sat is 88-90% when sleeping. Pt. Rouses easily, and is oriented when awake. Will continue to monitor.

## 2022-05-29 NOTE — ED Triage Notes (Signed)
Pt to ED via [facility] from home for overdose. Per EMS pt reports that she took a 30 mg Percocet from the streets. Pt also took Gabapentin. Pt also used Crack yesterday. Pt denies ETOH. EMS reports CPR was initiated by pts boy friend. However, when fire arrived pt did have a pulse but was not breathing. Pt was being ventilated via trach tube. Pt was given 4 mg of narcan intranasal and 2 mg IV narcan.

## 2022-05-29 NOTE — Discharge Instructions (Addendum)
   Thank you for choosing us for your health care today!  Please see your primary doctor this week for a follow up appointment.   If you do not have a primary doctor call the following clinics to establish care:  If you have insurance:  [facility] [phone] [address] [place] [address]   [facility]  [phone] [address] [place] [address]   If you do not have insurance:  [facility]  [phone] [address] [place] [address]  Sometimes, in the early stages of certain disease courses it is difficult to detect in the emergency department evaluation -- so, it is important that you continue to monitor your symptoms and call your doctor right away or return to the emergency department if you develop any new or worsening symptoms.  It was my pleasure to care for you today.   Adam Sanjuan S. Neville Walston, MD  

## 2022-10-15 ENCOUNTER — Encounter: Payer: Self-pay | Admitting: Emergency Medicine

## 2022-10-15 ENCOUNTER — Emergency Department: Payer: Medicaid Other

## 2022-10-15 ENCOUNTER — Observation Stay
Admission: EM | Admit: 2022-10-15 | Discharge: 2022-10-17 | Disposition: A | Discharge status: Home / Self Care | Payer: Medicaid Other | Attending: Internal Medicine | Admitting: Internal Medicine

## 2022-10-15 ENCOUNTER — Other Ambulatory Visit: Payer: Self-pay

## 2022-10-15 DIAGNOSIS — Z930 Tracheostomy status: Secondary | ICD-10-CM | POA: Insufficient documentation

## 2022-10-15 DIAGNOSIS — F209 Schizophrenia, unspecified: Secondary | ICD-10-CM | POA: Diagnosis not present

## 2022-10-15 DIAGNOSIS — Z79899 Other long term (current) drug therapy: Secondary | ICD-10-CM | POA: Insufficient documentation

## 2022-10-15 DIAGNOSIS — T4271XA Poisoning by unspecified antiepileptic and sedative-hypnotic drugs, accidental (unintentional), initial encounter: Secondary | ICD-10-CM | POA: Diagnosis not present

## 2022-10-15 DIAGNOSIS — J9503 Malfunction of tracheostomy stoma: Secondary | ICD-10-CM

## 2022-10-15 DIAGNOSIS — R7301 Impaired fasting glucose: Secondary | ICD-10-CM | POA: Insufficient documentation

## 2022-10-15 DIAGNOSIS — R569 Unspecified convulsions: Secondary | ICD-10-CM

## 2022-10-15 DIAGNOSIS — Z6841 Body Mass Index (BMI) 40.0 and over, adult: Secondary | ICD-10-CM | POA: Diagnosis not present

## 2022-10-15 DIAGNOSIS — F1410 Cocaine abuse, uncomplicated: Secondary | ICD-10-CM | POA: Diagnosis present

## 2022-10-15 DIAGNOSIS — T50901A Poisoning by unspecified drugs, medicaments and biological substances, accidental (unintentional), initial encounter: Secondary | ICD-10-CM | POA: Diagnosis not present

## 2022-10-15 DIAGNOSIS — T43291A Poisoning by other antidepressants, accidental (unintentional), initial encounter: Secondary | ICD-10-CM | POA: Diagnosis not present

## 2022-10-15 DIAGNOSIS — T43501A Poisoning by unspecified antipsychotics and neuroleptics, accidental (unintentional), initial encounter: Secondary | ICD-10-CM | POA: Insufficient documentation

## 2022-10-15 DIAGNOSIS — F17210 Nicotine dependence, cigarettes, uncomplicated: Secondary | ICD-10-CM | POA: Diagnosis not present

## 2022-10-15 DIAGNOSIS — F333 Major depressive disorder, recurrent, severe with psychotic symptoms: Secondary | ICD-10-CM | POA: Insufficient documentation

## 2022-10-15 DIAGNOSIS — R739 Hyperglycemia, unspecified: Secondary | ICD-10-CM | POA: Insufficient documentation

## 2022-10-15 DIAGNOSIS — T50902A Poisoning by unspecified drugs, medicaments and biological substances, intentional self-harm, initial encounter: Secondary | ICD-10-CM | POA: Diagnosis present

## 2022-10-15 LAB — URINALYSIS, ROUTINE W REFLEX MICROSCOPIC
Bilirubin Urine: NEGATIVE
Glucose, UA: NEGATIVE mg/dL
Ketones, ur: NEGATIVE mg/dL
Leukocytes,Ua: NEGATIVE
Nitrite: NEGATIVE
Protein, ur: 30 mg/dL — AB
Specific Gravity, Urine: 1.02 (ref 1.005–1.030)
pH: 5 (ref 5.0–8.0)

## 2022-10-15 LAB — CBC WITH DIFFERENTIAL/PLATELET
Abs Immature Granulocytes: 0.05 10*3/uL (ref 0.00–0.07)
Basophils Absolute: 0 10*3/uL (ref 0.0–0.1)
Basophils Relative: 0 %
Eosinophils Absolute: 0 10*3/uL (ref 0.0–0.5)
Eosinophils Relative: 0 %
HCT: 44.3 % (ref 36.0–46.0)
Hemoglobin: 14.5 g/dL (ref 12.0–15.0)
Immature Granulocytes: 0 %
Lymphocytes Relative: 10 %
Lymphs Abs: 1.1 10*3/uL (ref 0.7–4.0)
MCH: 30.1 pg (ref 26.0–34.0)
MCHC: 32.7 g/dL (ref 30.0–36.0)
MCV: 91.9 fL (ref 80.0–100.0)
Monocytes Absolute: 0.7 10*3/uL (ref 0.1–1.0)
Monocytes Relative: 6 %
Neutro Abs: 9.7 10*3/uL — ABNORMAL HIGH (ref 1.7–7.7)
Neutrophils Relative %: 84 %
Platelets: 196 10*3/uL (ref 150–400)
RBC: 4.82 MIL/uL (ref 3.87–5.11)
RDW: 12.9 % (ref 11.5–15.5)
WBC: 11.6 10*3/uL — ABNORMAL HIGH (ref 4.0–10.5)
nRBC: 0 % (ref 0.0–0.2)

## 2022-10-15 LAB — COMPREHENSIVE METABOLIC PANEL
ALT: 10 U/L (ref 0–44)
AST: 15 U/L (ref 15–41)
Albumin: 4.1 g/dL (ref 3.5–5.0)
Alkaline Phosphatase: 63 U/L (ref 38–126)
Anion gap: 10 (ref 5–15)
BUN: 12 mg/dL (ref 6–20)
CO2: 24 mmol/L (ref 22–32)
Calcium: 9.1 mg/dL (ref 8.9–10.3)
Chloride: 104 mmol/L (ref 98–111)
Creatinine, Ser: 0.72 mg/dL (ref 0.44–1.00)
GFR, Estimated: >60 mL/min (ref >60)
Glucose, Bld: 112 mg/dL — ABNORMAL HIGH (ref 70–99)
Potassium: 4.2 mmol/L (ref 3.5–5.1)
Sodium: 138 mmol/L (ref 135–145)
Total Bilirubin: 0.6 mg/dL (ref 0.3–1.2)
Total Protein: 8.1 g/dL (ref 6.5–8.1)

## 2022-10-15 LAB — ACETAMINOPHEN LEVEL
Acetaminophen (Tylenol), Serum: <10 ug/mL — ABNORMAL LOW (ref 10–30)
Acetaminophen (Tylenol), Serum: <10 ug/mL — ABNORMAL LOW (ref 10–30)

## 2022-10-15 LAB — ETHANOL: Alcohol, Ethyl (B): <10 mg/dL (ref <10)

## 2022-10-15 LAB — LIPASE, BLOOD: Lipase: 26 U/L (ref 11–51)

## 2022-10-15 LAB — PREGNANCY, URINE: Preg Test, Ur: NEGATIVE

## 2022-10-15 LAB — SALICYLATE LEVEL: Salicylate Lvl: <7 mg/dL — ABNORMAL LOW (ref 7.0–30.0)

## 2022-10-15 LAB — HEMOGLOBIN A1C
Hgb A1c MFr Bld: 5 % (ref 4.8–5.6)
Mean Plasma Glucose: 96.8 mg/dL

## 2022-10-15 MED ORDER — ORAL CARE MOUTH RINSE: 15.0000 mL | OROMUCOSAL | Status: DC | PRN

## 2022-10-15 MED ORDER — ONDANSETRON HCL 4 MG PO TABS: 4.0000 mg | ORAL_TABLET | Freq: Four times a day (QID) | ORAL | Status: DC | PRN

## 2022-10-15 MED ORDER — ENOXAPARIN SODIUM 80 MG/0.8ML IJ SOSY
0.5000 mg/kg | PREFILLED_SYRINGE | INTRAMUSCULAR | Status: DC
  Administered 2022-10-15: 72.5 mg via SUBCUTANEOUS
  Filled 2022-10-15 (×3): qty 0.72

## 2022-10-15 MED ORDER — ONDANSETRON HCL 4 MG/2ML IJ SOLN: 4.0000 mg | Freq: Four times a day (QID) | INTRAMUSCULAR | Status: DC | PRN

## 2022-10-15 MED ORDER — LORAZEPAM 2 MG/ML IJ SOLN: 2.0000 mg | INTRAMUSCULAR | Status: DC | PRN

## 2022-10-15 MED ORDER — LACTATED RINGERS IV BOLUS
1000.0000 mL | Freq: Once | INTRAVENOUS | Status: AC
  Administered 2022-10-15: 1000 mL via INTRAVENOUS

## 2022-10-15 MED ORDER — ENOXAPARIN SODIUM 40 MG/0.4ML IJ SOSY: 40.0000 mg | PREFILLED_SYRINGE | INTRAMUSCULAR | Status: DC

## 2022-10-15 MED ORDER — ORAL CARE MOUTH RINSE
15.0000 mL | OROMUCOSAL | Status: DC
  Administered 2022-10-16: 15 mL via OROMUCOSAL
  Filled 2022-10-15 (×21): qty 15

## 2022-10-15 MED ORDER — LORAZEPAM 0.5 MG PO TABS: 0.5000 mg | ORAL_TABLET | Freq: Two times a day (BID) | ORAL | Status: DC | PRN

## 2022-10-15 NOTE — H&P (Signed)
History and Physical    Patient: Denise Gibbs DGU:[id number] DOB: Sep 03, 1996 DOA: 10/15/2022 DOS: the patient was seen and examined on 10/15/2022 PCP: System, Provider Not In  Patient coming from: Home  Chief Complaint:  Chief Complaint  Patient presents with   Seizures   HPI: Denise Gibbs is a 27 y.o. 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  Denise Gibbs states that yesterday, she had a stressful day due to arguments with her fianc and her parents.  Due to this, she was feeling very overwhelmed.  Last night, she took a "handful" of Wellbutrin and believes she took extra gabapentin and Seroquel as well.  She is unsure exactly how much she took but believes she took the most of Wellbutrin.  Last night, she began to have twitching of her facial muscles.  When she awoke today, she continued to have twitching of her facial muscles at times.  Her fianc states that after some twitching of her face, she began jerking with all of her extremities and her eyes rolled.  She stopped breathing for several seconds but then began breathing spontaneous.  Denise Gibbs states she did have some urinary incontinence during this episode.  She was initially confused after the seizure-like activity, but on arrival to the ED, mentation improved.  ED course: On arrival to the ED, patient was hypertensive at 152/103 with heart rate of 96.  She was afebrile at 97.7 and saturating at 97% on room air. Initial workup remarkable for potassium 4.2, bicarb 24, creatinine 0.72, WBC of 11.6, negative pregnancy test.  Ethanol levels negative.  CT head was obtained that did not show any acute intracranial abnormalities.  Psychiatry consulted.  Due to seizure from gabapentin and Wellbutrin overdose, [facility] contacted for observation.  Review of Systems: As mentioned in the history of present  illness. All other systems reviewed and are negative.  Past Medical History:  Diagnosis Date   Anxiety    Depression    Past Surgical History:  Procedure Laterality Date   TONSILLECTOMY     Social History:  reports that she has been smoking cigarettes. She has been smoking an average of .5 packs per day. She has never used smokeless tobacco. She reports current alcohol use. She reports current drug use. Drug: Marijuana.  No Known Allergies  Family History  Problem Relation Age of Onset   Hypertension Father    Hypertension Paternal Uncle    Hypertension Paternal Grandfather     Prior to Admission medications   Medication Sig Start Date End Date Taking? Authorizing Provider  albuterol (VENTOLIN HFA) 108 (90 Base) MCG/ACT inhaler Inhale 2 puffs into the lungs every 6 (six) hours as needed. 11/03/21 12/03/21  [provider]  buPROPion (WELLBUTRIN XL) 150 MG 24 hr tablet TAKE 1 TABLET BY MOUTH ONCE EVERY DAY. 12/27/21     calcitRIOL (ROCALTROL) 0.25 MCG capsule Take 1 capsule by mouth as directed. 10/21/21 10/21/22  [provider]  clonazePAM (KLONOPIN) 0.5 MG tablet Take 0.5 tablets by mouth as needed. 11/03/21   [provider]  diclofenac Sodium (VOLTAREN) 1 % GEL Apply 1 application topically in the morning, at noon, in the evening, and at bedtime. 10/20/21 10/20/22  [provider]  fluticasone (FLONASE) 50 MCG/ACT nasal spray Place 1 spray into both nostrils daily. 10/21/21 10/21/22  [provider]  gabapentin (NEURONTIN) 300 MG capsule Take 2 capsules by mouth  in the morning, at noon, in the evening, and at bedtime. 11/03/21 12/03/21  [provider]  gabapentin (NEURONTIN) 600 MG tablet TAKE 1 TABLET BY MOUTH 4 TIMES DAILY. 12/27/21     gabapentin (NEURONTIN) 800 MG tablet Take 800 mg by mouth 4 (four) times daily. 05/09/22   [provider]  paliperidone (INVEGA SUSTENNA) 234 MG/1.5ML SUSY injection INJECT 1.5ML (234MG  TOTAL) INTO  THE MUSCLE ONCE. (THEN GIVE 156MG  INJECTION ONE WEEK LATER). 11/09/21     Petrolatum OINT Apply topically. 10/20/21   [provider]  potassium chloride SA (KLOR-CON M) 20 MEQ tablet Take 2 tablets by mouth daily. 11/03/21   [provider]  propranolol (INDERAL) 20 MG tablet TAKE 2 TABLETS (40MG ) BY MOUTH ONCE DAILY AS NEEDED FOR ANXIETY. 12/27/21     pseudoephedrine (SUDAFED) 30 MG tablet Take 30 mg by mouth every 8 (eight) hours as needed. 10/20/21   [provider]  QUEtiapine (SEROQUEL) 100 MG tablet TAKE 1 TABLET BY MOUTH ONCE NIGHTLY. 12/27/21     mirtazapine (REMERON) 30 MG tablet TAKE ONE TABLET BY MOUTH AT BEDTIME 10/04/20 07/13/21    paliperidone (INVEGA) 6 MG 24 hr tablet TAKE TWO TABLETS BY MOUTH EVERY MORNING 10/04/20 05/13/21      Physical Exam: Vitals:   10/15/22 1211 10/15/22 1230  BP: (!) 152/103   Pulse: (!) 103 96  Resp: 17 20  Temp: 97.7 F (36.5 C)   TempSrc: Oral   SpO2: 97% 97%   Physical Exam Vitals and nursing note reviewed.  Constitutional:      General: She is not in acute distress.    Appearance: She is obese.  HENT:     Head: Normocephalic and atraumatic.     Mouth/Throat:     Mouth: Mucous membranes are moist.     Pharynx: Oropharynx is clear.     Comments: No tongue lacerations noted.  No blood in the oropharynx noted. Eyes:     Conjunctiva/sclera: Conjunctivae normal.     Pupils: Pupils are equal, round, and reactive to light.  Cardiovascular:     Rate and Rhythm: Normal rate and regular rhythm.     Heart sounds: Heart sounds are distant (Due to body habitus). No murmur heard. Pulmonary:     Effort: Pulmonary effort is normal. No respiratory distress.     Breath sounds: Normal breath sounds. No wheezing or rales.     Comments: Tracheostomy in place with speaking valve. Abdominal:     General: Bowel sounds are normal.     Palpations: Abdomen is soft.  Musculoskeletal:     Right lower leg: No edema.     Left lower leg: No  edema.  Skin:    General: Skin is warm and dry.  Neurological:     Mental Status: She is alert and oriented to person, place, and time.     Motor: No tremor, abnormal muscle tone or seizure activity.  Psychiatric:        Attention and Perception: She perceives auditory and visual (Active visual hallucinations during examination, predominantly animals.) hallucinations.        Mood and Affect: Mood is anxious. Affect is tearful.        Speech: Speech normal.        Behavior: Behavior is slowed. Behavior is cooperative.        Thought Content: Thought content does not include homicidal or suicidal ideation. Thought content does not include homicidal or suicidal plan.    Data Reviewed:  CBC with WBC of 11.6, hemoglobin of 14.5, platelets of 196 CMP with sodium of 138, potassium of 4.2, bicarb 24, glucose 112, BUN 12, creatinine 0.72, calcium 9.1, anion gap 10, AST 15, ALT 10, GFR over 60 Glucose 102 Tylenol and salicylate levels negative Ethanol levels negative Pregnancy test negative Urinalysis, nonclean-catch with many squamous epithelial, with a large hemoglobin, 0-5 RBC/hpf and many bacteria.  EKG was personally reviewed.  Sinus rhythm with rate of 104.  Borderline first-degree AV block.  QTc prolonged at 488.  CT Head Wo Contrast  Result Date: 10/15/2022 CLINICAL DATA:  Seizures EXAM: CT HEAD WITHOUT CONTRAST TECHNIQUE: Contiguous axial images were obtained from the base of the skull through the vertex without intravenous contrast. RADIATION DOSE REDUCTION: This exam was performed according to the departmental dose-optimization program which includes automated exposure control, adjustment of the mA and/or kV according to patient size and/or use of iterative reconstruction technique. COMPARISON:  None Available. FINDINGS: Brain: No evidence of acute infarction, hemorrhage, hydrocephalus, extra-axial collection or mass lesion/mass effect. Vascular: No hyperdense vessel or unexpected  calcification. Skull: No osseous abnormality. Sinuses/Orbits: Visualized paranasal sinuses are clear. Visualized mastoid sinuses are clear. Visualized orbits demonstrate no focal abnormality. Other: Multiple frontal sebaceous cyst. IMPRESSION: 1. No acute intracranial findings. Electronically Signed   By: Kathreen Devoid M.D.   On: 10/15/2022 13:13    There are no new results to review at this time.  Assessment and Plan:  * Seizure [facility]) Patient presenting after witnessed seizure at home in the setting of gabapentin, Wellbutrin and Seroquel overdose.  No additional seizure activity since arriving to the ED.  Given patient is returning to baseline at this time, will hold off on EEG  - Admit to progressive floor for frequent neurochecks - Seizure precautions - Ativan 2 mg as needed for seizure activity - If additional seizures occur, consult neurology  Polysubstance overdose Patient states that she took a large amount of Wellbutrin in addition to increased gabapentin and Seroquel.  She is uncertain exactly how much she took.  She took this in a state of distress/anxiety but denies suicidal thoughts or intent.  QTc on EKG prolonged at 488.  No evidence on examination to suggest serotonin syndrome.  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  Active hallucinations, both auditory and visual, however seem neutral in tone.  - Psychiatry consulted given overdose; appreciate their recommendations  Advance Care Planning:   Code Status: Full Code.   Consults: Psychiatry   Family Communication: Patient's fianc updated at bedside  Severity of Illness: The appropriate patient  status for this patient is OBSERVATION. Observation status is judged to be reasonable and necessary in order to provide the required intensity of service to ensure the patient's safety. The patient's presenting symptoms, physical exam findings, and initial radiographic and laboratory data in the context of their medical condition is felt to place them at decreased risk for further clinical deterioration. Furthermore, it is anticipated that the patient will be medically stable for discharge from the hospital within 2 midnights of admission.   Author: Jose Persia, MD 10/15/2022 3:18 PM  For on call review [email or web address].

## 2022-10-15 NOTE — Consult Note (Signed)
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 female patient admitted with overdose.  HPI:  27 yo female presented with an overdose, severe after becoming upset over family issues.  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  Minimizes her attempt on assessment and stated she was leaving.  Depression fluctuates, high recently with stressors.  High anxiety with panic attacks at times.  Trauma with nightmares frequently.  Denies psychosis and homicidal ideations.  She reports vaping nicotine daily and the occasional use of marijuana, UDS not back yet.  Her fiance is at her bedside and supportive.  Psych admission needed.  Past Psychiatric History: depression, anxiety, multiple overdoses  Risk to Self:  yes Risk to Others:  none Prior Inpatient Therapy:  several Prior Outpatient Therapy:  yes, [facility]  Past Medical History:  Past Medical History:  Diagnosis Date   Anxiety    Depression     Past Surgical History:  Procedure Laterality Date   TONSILLECTOMY     Family History:  Family History  Problem Relation Age of Onset   Hypertension Father    Hypertension Paternal Uncle    Hypertension Paternal Grandfather    Family Psychiatric  History: none Social History:  Social History   Substance and Sexual Activity  Alcohol Use Yes     Social History   Substance and Sexual Activity  Drug Use Yes   Types: Marijuana    Social History   Socioeconomic History   Marital status: Single    Spouse  name: Not on file   Number of children: Not on file   Years of education: Not on file   Highest education level: Not on file  Occupational History   Not on file  Tobacco Use   Smoking status: Every Day    Packs/day: 0.50    Types: Cigarettes   Smokeless tobacco: Never  Substance and Sexual Activity   Alcohol use: Yes   Drug use: Yes    Types: Marijuana   Sexual activity: Not on file  Other Topics Concern   Not on file  Social History Narrative   Not on file   Social Determinants of Health   Financial Resource Strain: Not on file  Food Insecurity: Not on file  Transportation Needs: Not on file  Physical Activity: Not on file  Stress: Not on file  Social Connections: Not on file   Additional Social History:    Allergies:  No Known Allergies  Labs:  Results for orders placed or performed during the hospital encounter of 10/15/22 (from the past 48 hour(s))  Acetaminophen level     Status: Abnormal   Collection Time: 10/15/22 12:34 PM  Result Value Ref Range   Acetaminophen (Tylenol), Serum <10 (L) 10 - 30 ug/mL    Comment: (NOTE) Therapeutic concentrations vary significantly. A range of 10-30 ug/mL  may be an effective concentration for many patients. However, some  are best treated at concentrations outside of this range. Acetaminophen concentrations >150 ug/mL at 4 hours after ingestion  and >50 ug/mL at 12  hours after ingestion are often associated with  toxic reactions.  Performed at [facility], [address] [place], [place] [address]   Comprehensive metabolic panel     Status: Abnormal   Collection Time: 10/15/22 12:34 PM  Result Value Ref Range   Sodium 138 135 - 145 mmol/L   Potassium 4.2 3.5 - 5.1 mmol/L    Comment: HEMOLYSIS AT THIS LEVEL MAY AFFECT RESULT   Chloride 104 98 - 111 mmol/L   CO2 24 22 - 32 mmol/L   Glucose, Bld 112 (H) 70 - 99 mg/dL    Comment: Glucose reference range applies only to samples taken after fasting for at  least 8 hours.   BUN 12 6 - 20 mg/dL   Creatinine, Ser 0.72 0.44 - 1.00 mg/dL   Calcium 9.1 8.9 - 10.3 mg/dL   Total Protein 8.1 6.5 - 8.1 g/dL   Albumin 4.1 3.5 - 5.0 g/dL   AST 15 15 - 41 U/L   ALT 10 0 - 44 U/L   Alkaline Phosphatase 63 38 - 126 U/L   Total Bilirubin 0.6 0.3 - 1.2 mg/dL   GFR, Estimated >60 >60 mL/min    Comment: (NOTE) Calculated using the CKD-EPI Creatinine Equation (2021)    Anion gap 10 5 - 15    Comment: Performed at [facility], [address], [place], [place] [address]  Ethanol     Status: None   Collection Time: 10/15/22 12:34 PM  Result Value Ref Range   Alcohol, Ethyl (B) <10 <10 mg/dL    Comment: (NOTE) Lowest detectable limit for serum alcohol is 10 mg/dL.  For medical purposes only. Performed at [facility], [address] [place], [place] [address]   Lipase, blood     Status: None   Collection Time: 10/15/22 12:34 PM  Result Value Ref Range   Lipase 26 11 - 51 U/L    Comment: Performed at [facility], [address] [place], [place] [address]  Salicylate level     Status: Abnormal   Collection Time: 10/15/22 12:34 PM  Result Value Ref Range   Salicylate Lvl <7.1 (L) 7.0 - 30.0 mg/dL    Comment: Performed at [facility], [address] [place], [place] [address]  CBC with Differential     Status: Abnormal   Collection Time: 10/15/22 12:34 PM  Result Value Ref Range   WBC 11.6 (H) 4.0 - 10.5 K/uL   RBC 4.82 3.87 - 5.11 MIL/uL   Hemoglobin 14.5 12.0 - 15.0 g/dL   HCT 44.3 36.0 - 46.0 %   MCV 91.9 80.0 - 100.0 fL   MCH 30.1 26.0 - 34.0 pg   MCHC 32.7 30.0 - 36.0 g/dL   RDW 12.9 11.5 - 15.5 %   Platelets 196 150 - 400 K/uL   nRBC 0.0 0.0 - 0.2 %   Neutrophils Relative % 84 %   Neutro Abs 9.7 (H) 1.7 - 7.7 K/uL   Lymphocytes Relative 10 %   Lymphs Abs 1.1 0.7 - 4.0 K/uL   Monocytes Relative 6 %   Monocytes Absolute 0.7 0.1 - 1.0 K/uL   Eosinophils Relative 0 %   Eosinophils Absolute  0.0 0.0 - 0.5 K/uL   Basophils Relative 0 %   Basophils Absolute 0.0 0.0 - 0.1 K/uL   Immature Granulocytes 0 %   Abs Immature Granulocytes 0.05 0.00 - 0.07 K/uL    Comment: Performed at [facility], [address] [place], [place] [address]  Pregnancy, urine     Status: None   Collection Time: 10/15/22 12:34 PM  Result Value Ref Range   Preg Test, Ur NEGATIVE NEGATIVE    Comment: Performed at [facility], [address] [place], [place] [address]  Urinalysis, Routine w reflex microscopic     Status: Abnormal   Collection Time: 10/15/22 12:34 PM  Result Value Ref Range   Color, Urine YELLOW (A) YELLOW   APPearance HAZY (A) CLEAR   Specific Gravity, Urine 1.020 1.005 - 1.030   pH 5.0 5.0 - 8.0   Glucose, UA NEGATIVE NEGATIVE mg/dL   Hgb urine dipstick LARGE (A) NEGATIVE   Bilirubin Urine NEGATIVE NEGATIVE   Ketones, ur NEGATIVE NEGATIVE mg/dL   Protein, ur 30 (A) NEGATIVE mg/dL   Nitrite NEGATIVE NEGATIVE   Leukocytes,Ua NEGATIVE NEGATIVE   RBC / HPF 0-5 0 - 5 RBC/hpf   WBC, UA 0-5 0 - 5 WBC/hpf   Bacteria, UA MANY (A) NONE SEEN   Squamous Epithelial / HPF 11-20 0 - 5 /HPF   Mucus PRESENT     Comment: Performed at [facility], [address], [place], [place] [address]    Current Facility-Administered Medications  Medication Dose Route Frequency Provider Last Rate Last Admin   enoxaparin (LOVENOX) injection 72.5 mg  0.5 mg/kg Subcutaneous Q24H Liana Gerold E, RPH       LORazepam (ATIVAN) injection 2 mg  2 mg Intravenous Q5 Min x 2 PRN Verdene Lennert, MD       LORazepam (ATIVAN) tablet 0.5 mg  0.5 mg Oral BID PRN Charm Rings, NP       ondansetron (ZOFRAN) tablet 4 mg  4 mg Oral Q6H PRN Verdene Lennert, MD       Or   ondansetron (ZOFRAN) injection 4 mg  4 mg Intravenous Q6H PRN Verdene Lennert, MD       Oral care mouth rinse  15 mL Mouth Rinse Q2H Verdene Lennert, MD       Oral care mouth rinse  15 mL Mouth Rinse PRN Verdene Lennert, MD        Current Outpatient Medications  Medication Sig Dispense Refill   QUEtiapine (SEROQUEL) 100 MG tablet TAKE 1 TABLET BY MOUTH ONCE NIGHTLY. 30 tablet 0   calcitRIOL (ROCALTROL) 0.25 MCG capsule Take 1 capsule by mouth as directed.     clonazePAM (KLONOPIN) 0.5 MG tablet Take 0.5 tablets by mouth as needed.     diclofenac Sodium (VOLTAREN) 1 % GEL Apply 1 application topically in the morning, at noon, in the evening, and at bedtime.     fluticasone (FLONASE) 50 MCG/ACT nasal spray Place 1 spray into both nostrils daily.     gabapentin (NEURONTIN) 800 MG tablet Take 800 mg by mouth 4 (four) times daily.     Petrolatum OINT Apply topically.     potassium chloride SA (KLOR-CON M) 20 MEQ tablet Take 2 tablets by mouth daily.     propranolol (INDERAL) 20 MG tablet TAKE 2 TABLETS (40MG ) BY MOUTH ONCE DAILY AS NEEDED FOR ANXIETY. 60 tablet 0   pseudoephedrine (SUDAFED) 30 MG tablet Take 30 mg by mouth every 8 (eight) hours as needed.      Musculoskeletal: Strength & Muscle Tone: within normal limits Gait & Station: normal Patient leans: N/A  Psychiatric Specialty Exam: Physical Exam Vitals and nursing note reviewed.  Constitutional:      Appearance: Normal appearance.  HENT:  Nose: Nose normal.  Pulmonary:     Effort: Pulmonary effort is normal.  Musculoskeletal:        General: Normal range of motion.     Cervical back: Normal range of motion.  Neurological:     General: No focal deficit present.     Mental Status: She is alert and oriented to person, place, and time.  Psychiatric:        Attention and Perception: Attention and perception normal.        Mood and Affect: Mood is anxious and depressed.        Speech: Speech normal.        Behavior: Behavior normal. Behavior is cooperative.        Thought Content: Thought content includes suicidal ideation.        Cognition and Memory: Cognition is impaired.        Judgment: Judgment is impulsive.     Review of Systems   Psychiatric/Behavioral:  Positive for depression, substance abuse and suicidal ideas. The patient is nervous/anxious.   All other systems reviewed and are negative.   Blood pressure (!) 152/103, pulse 96, temperature 97.7 F (36.5 C), temperature source Oral, resp. rate 20, height 5\' 5"  (1.651 m), weight (!) 145.2 kg, SpO2 97 %.Body mass index is 53.25 kg/m.  General Appearance: Disheveled  Eye Contact:  Fair  Speech:  Normal Rate  Volume:  Normal  Mood:  Anxious, Depressed, and Irritable  Affect:  Congruent  Thought Process:  Coherent  Orientation:  Full (Time, Place, and Person)  Thought Content:  Rumination  Suicidal Thoughts:  Yes.  with intent/plan  Homicidal Thoughts:  No  Memory:  Immediate;   Fair Recent;   Fair Remote;   Fair  Judgement:  Poor  Insight:  Lacking  Psychomotor Activity:  Normal  Concentration:  Concentration: Fair and Attention Span: Fair  Recall:  [employer] of Knowledge:  Fair  Language:  Good  Akathisia:  No  Handed:  Right  AIMS (if indicated):     Assets:  Housing Leisure Time Resilience Social Support  ADL's:  Intact  Cognition:  WNL  Sleep:        Physical Exam: Physical Exam Vitals and nursing note reviewed.  Constitutional:      Appearance: Normal appearance.  HENT:     Nose: Nose normal.  Pulmonary:     Effort: Pulmonary effort is normal.  Musculoskeletal:        General: Normal range of motion.     Cervical back: Normal range of motion.  Neurological:     General: No focal deficit present.     Mental Status: She is alert and oriented to person, place, and time.  Psychiatric:        Attention and Perception: Attention and perception normal.        Mood and Affect: Mood is anxious and depressed.        Speech: Speech normal.        Behavior: Behavior normal. Behavior is cooperative.        Thought Content: Thought content includes suicidal ideation.        Cognition and Memory: Cognition is impaired.        Judgment:  Judgment is impulsive.    Review of Systems  Psychiatric/Behavioral:  Positive for depression, substance abuse and suicidal ideas. The patient is nervous/anxious.   All other systems reviewed and are negative.  Blood pressure (!) 152/103, pulse 96, temperature 97.7 F (  36.5 C), temperature source Oral, resp. rate 20, height 5\' 5"  (1.651 m), weight (!) 145.2 kg, SpO2 97 %. Body mass index is 53.25 kg/m.  Treatment Plan Summary: Daily contact with patient to assess and evaluate symptoms and progress in treatment, Medication management, and Plan : Major depressive disorder, recurrent, severe without psychosis: Medications not started due to her overdose  Disposition: Recommend psychiatric Inpatient admission when medically cleared.  , NP 10/15/2022 4:59 PM

## 2022-10-15 NOTE — Assessment & Plan Note (Addendum)
Patient is on monthly Invega and follows with [facility].  Active hallucinations, both auditory and visual, however seem neutral in tone.  - Psychiatry consulted given overdose; appreciate their recommendations

## 2022-10-15 NOTE — ED Provider Notes (Addendum)
[facility] Provider Note    Event Date/Time   First MD Initiated Contact with Patient 10/15/22 1213     (approximate)   History   Seizures   HPI  Denise Gibbs is a 27 y.o. female with past medical tree significant for anxiety, depression, substance abuse, tracheostomy, presents to the emergency department as a seizure.  Patient had a witnessed seizure from family members at home prior to arrival.  Patient states that she took a large number of gabapentin and Wellbutrin last night.  120 mg tablets of gabapentin was filled last week and it was empty today.  She is unable to state what other medications or the quantity of which she took.  Denies any history of a seizure disorder.  Also endorses crack cocaine last week.  Denies any IV drug use.  Denies any suicidal ideation and states that this was not a suicide attempt but she does  follow with .  Denies any fever or chills.  Feels back to her normal.     Physical Exam   Triage Vital Signs: ED Triage Vitals  Enc Vitals Group     BP 10/15/22 1211 (!) 152/103     Pulse Rate 10/15/22 1211 (!) 103     Resp 10/15/22 1211 17     Temp 10/15/22 1211 97.7 F (36.5 C)     Temp Source 10/15/22 1211 Oral     SpO2 10/15/22 1211 97 %     Weight --      Height --      Head Circumference --      Peak Flow --      Pain Score 10/15/22 1212 0     Pain Loc --      Pain Edu? --      Excl. in [place]? --     Most recent vital signs: Vitals:   10/15/22 1211 10/15/22 1230  BP: (!) 152/103   Pulse: (!) 103 96  Resp: 17 20  Temp: 97.7 F (36.5 C)   SpO2: 97% 97%    Physical Exam Constitutional:      Appearance: She is well-developed.  HENT:     Head: Atraumatic.  Eyes:     Conjunctiva/sclera: Conjunctivae normal.  Neck:     Comments: Tracheostomy Cardiovascular:     Rate and Rhythm: Regular rhythm.  Pulmonary:     Effort: No respiratory distress.  Abdominal:     General: There is no distension.   Musculoskeletal:        General: Normal range of motion.     Cervical back: Normal range of motion.  Skin:    General: Skin is warm.  Neurological:     Mental Status: She is alert. Mental status is at baseline.     Comments: Cranial nerves grossly intact.  5/5 strength bilateral upper and lower extremities.  Sensation intact in upper and lower extremities.       IMPRESSION / MDM / ASSESSMENT AND PLAN / ED COURSE  I reviewed the triage vital signs and the nursing notes.  Clinical picture most concerning for overdose of home medications of gabapentin and Wellbutrin.  On chart review patient has had overdoses in the past.  Denies a suicide attempt and states that she was only trying to improve her pain.  Do not feel that the patient meets criteria for involuntary commitment at this time.  Clinical picture concerning for overdose, new seizure, intracranial hemorrhage.  Low suspicion for meningitis, no  fever or signs of meningitis on exam.  Poison control notified  Given 1 L of IV fluid    EKG  I, Nathaniel Man, the attending physician, personally viewed and interpreted this ECG.   Rate: Normal  Rhythm: Normal sinus  Axis: Normal  Intervals: QTc 400s.  ST&T Change: None  No tachycardic or bradycardic dysrhythmias while on cardiac telemetry.  RADIOLOGY I independently reviewed imaging, my interpretation of imaging: CT scan of head without signs of acute intracranial hemorrhage or infarction  LABS (all labs ordered are listed, but only abnormal results are displayed) Labs interpreted as -  No signs of co-ingestion.  No significant electrolyte abnormalities.  Labs Reviewed  ACETAMINOPHEN LEVEL - Abnormal; Notable for the following components:      Result Value   Acetaminophen (Tylenol), Serum <10 (*)    All other components within normal limits  COMPREHENSIVE METABOLIC PANEL - Abnormal; Notable for the following components:   Glucose, Bld 112 (*)    All other components  within normal limits  SALICYLATE LEVEL - Abnormal; Notable for the following components:   Salicylate Lvl <6.7 (*)    All other components within normal limits  CBC WITH DIFFERENTIAL/PLATELET - Abnormal; Notable for the following components:   WBC 11.6 (*)    Neutro Abs 9.7 (*)    All other components within normal limits  URINALYSIS, ROUTINE W REFLEX MICROSCOPIC - Abnormal; Notable for the following components:   Color, Urine YELLOW (*)    APPearance HAZY (*)    Hgb urine dipstick LARGE (*)    Protein, ur 30 (*)    Bacteria, UA MANY (*)    All other components within normal limits  ETHANOL  LIPASE, BLOOD  PREGNANCY, URINE  POC URINE PREG, ED    TREATMENT  Poison control recommended 24-hour observation.  Stated that Wellbutrin and Seroquel overdose could cause seizures.  Recommended supportive therapy.  Would be medically cleared after 24 hours.  Consulted hospitalist for admission for observation for overdose.  Consulted psychiatry for intentional overdose.  Does have a history of schizoaffective disorder and multiple overdoses in the past.  The patient has been placed in psychiatric observation due to the need to provide a safe environment for the patient while obtaining psychiatric consultation and evaluation, as well as ongoing medical and medication management to treat the patient's condition.  The patient has not been placed under full IVC at this time.    PROCEDURES:  Critical Care performed: No  Procedures  Patient's presentation is most consistent with acute presentation with potential threat to life or bodily function.   MEDICATIONS ORDERED IN ED: Medications  lactated ringers bolus 1,000 mL (1,000 mLs Intravenous New Bag/Given 10/15/22 1259)    FINAL CLINICAL IMPRESSION(S) / ED DIAGNOSES   Final diagnoses:  Seizure ([place])  Accidental overdose, initial encounter     Rx / DC Orders   ED Discharge Orders     None        Note:  This document was  prepared using Dragon voice recognition software and may include unintentional dictation errors.   Nathaniel Man, MD 10/15/22 1356    Nathaniel Man, MD 10/15/22 [phone]

## 2022-10-15 NOTE — ED Notes (Addendum)
Pt refusing IV access. Per psychiatry, once she is medically cleared she will be admitted to psychiatry. Pt in doorway yelling at this RN that she wants to go home and she "will not be trapped". Charge RN notified, security called at this time.  Pt tearful, continuing to shout at this RN "I dont want to be here, I want to go home". Attempting to reason with pt, verbal reassurance provided, to no aid. Pt becoming increasingly agitated, disturbing neighboring patients in pod. Pt in doorway and hall using profanity. Assistance provided by security and pt directed back into room and into bed. Boyfriend sent to lobby at this time so pt can dress out. Pt dressed out with assistance from [place], [place] and security. Pt becoming more consolable and compliant at this time.

## 2022-10-15 NOTE — ED Notes (Signed)
Pt ambulatory to and from bathroom independently.

## 2022-10-15 NOTE — ED Notes (Signed)
Pt is asking for an inner cannula for her trach. RT called.

## 2022-10-15 NOTE — ED Notes (Signed)
Poison control called back for update.

## 2022-10-15 NOTE — ED Notes (Signed)
This RN spoke to poison control.  Notes : Wellbutrin and Seroquel could cause seizing.  Obs for 24/hrs.

## 2022-10-15 NOTE — Progress Notes (Signed)
Patient refused to get PIV access at this time. Informed patient's RN regarding this matter. Completed the IV consult at this time. HS [employer]

## 2022-10-15 NOTE — ED Triage Notes (Signed)
Patient to ED via [facility] from home for a witnessed seizure. No hx of same but hx of overdose. Patient has been taking her gabapentin as needed instead as prescribed. Patient had refill of 120 tabs last week and the bottle is now empty. Aox4. Denies suicide attempt- states she does have thoughts sometimes. Patient states she has also taken extra Wellbutrin.

## 2022-10-15 NOTE — Assessment & Plan Note (Signed)
Patient states that she took a large amount of Wellbutrin in addition to increased gabapentin and Seroquel.  She is uncertain exactly how much she took.  She took this in a state of distress/anxiety but denies suicidal thoughts or intent.  QTc on EKG prolonged at 488.  No evidence on examination to suggest serotonin syndrome.  - Psychiatry consulted; appreciate their recommendations - Telemetry monitoring, including QTc monitoring - Seizure precautions as noted above - If additional QT prolonging is noted, will administer mag sulfate

## 2022-10-15 NOTE — ED Notes (Addendum)
Poison Control called back and recommends repeat EKG and repeat labs.  Pattie [phone]

## 2022-10-15 NOTE — Progress Notes (Signed)
PHARMACIST - PHYSICIAN COMMUNICATION  CONCERNING:  Enoxaparin (Lovenox) for DVT Prophylaxis    RECOMMENDATION: Patient was prescribed enoxaprin 40mg  q24 hours for VTE prophylaxis.   Filed Weights   10/15/22 1533  Weight: (!) 145.2 kg (320 lb)    Body mass index is 53.25 kg/m.  Estimated Creatinine Clearance: 153.9 mL/min (by C-G formula based on SCr of 0.72 mg/dL).   Based on [place] patient is candidate for enoxaparin 0.5mg /kg TBW SQ every 24 hours based on BMI being >30.  DESCRIPTION: Pharmacy has adjusted enoxaparin dose per [facility] policy.  Patient is now receiving enoxaparin 72.5 mg every 24 hours    Alison Murray, PharmD Clinical Pharmacist  10/15/2022 3:44 PM

## 2022-10-15 NOTE — Assessment & Plan Note (Deleted)
Likely reactive, however given prior history of hyperglycemia, will check A1c.  -A1c pending

## 2022-10-15 NOTE — Assessment & Plan Note (Addendum)
Wellbutrin can lower your threshold for seizure.  Patient advised not to take any more Wellbutrin.  Patient also advised not to use cocaine.

## 2022-10-15 NOTE — ED Notes (Signed)
Ordered dinner tray for pt, snacks and shasta provided. Friend at bedside with pt. Call light in reach, [place].

## 2022-10-15 NOTE — Assessment & Plan Note (Addendum)
-  Continue home tracheostomy care.  Patient declined any nebulizer treatments.  She states that she sometimes gets a little wheeze and cough.  Does not feel like she has a cold and without fever.

## 2022-10-15 NOTE — ED Notes (Signed)
IVC, pend medical clearance

## 2022-10-16 DIAGNOSIS — F1410 Cocaine abuse, uncomplicated: Secondary | ICD-10-CM | POA: Diagnosis not present

## 2022-10-16 DIAGNOSIS — F333 Major depressive disorder, recurrent, severe with psychotic symptoms: Secondary | ICD-10-CM | POA: Diagnosis not present

## 2022-10-16 DIAGNOSIS — Z6841 Body Mass Index (BMI) 40.0 and over, adult: Secondary | ICD-10-CM

## 2022-10-16 DIAGNOSIS — T50902A Poisoning by unspecified drugs, medicaments and biological substances, intentional self-harm, initial encounter: Secondary | ICD-10-CM | POA: Diagnosis present

## 2022-10-16 DIAGNOSIS — T50901A Poisoning by unspecified drugs, medicaments and biological substances, accidental (unintentional), initial encounter: Secondary | ICD-10-CM | POA: Diagnosis present

## 2022-10-16 DIAGNOSIS — R569 Unspecified convulsions: Secondary | ICD-10-CM | POA: Diagnosis not present

## 2022-10-16 DIAGNOSIS — J9503 Malfunction of tracheostomy stoma: Secondary | ICD-10-CM | POA: Diagnosis not present

## 2022-10-16 DIAGNOSIS — R7301 Impaired fasting glucose: Secondary | ICD-10-CM | POA: Insufficient documentation

## 2022-10-16 LAB — COMPREHENSIVE METABOLIC PANEL
ALT: 10 U/L (ref 0–44)
AST: 10 U/L — ABNORMAL LOW (ref 15–41)
Albumin: 3.9 g/dL (ref 3.5–5.0)
Alkaline Phosphatase: 62 U/L (ref 38–126)
Anion gap: 8 (ref 5–15)
BUN: 10 mg/dL (ref 6–20)
CO2: 24 mmol/L (ref 22–32)
Calcium: 9.2 mg/dL (ref 8.9–10.3)
Chloride: 106 mmol/L (ref 98–111)
Creatinine, Ser: 0.52 mg/dL (ref 0.44–1.00)
GFR, Estimated: >60 mL/min (ref >60)
Glucose, Bld: 103 mg/dL — ABNORMAL HIGH (ref 70–99)
Potassium: 3.8 mmol/L (ref 3.5–5.1)
Sodium: 138 mmol/L (ref 135–145)
Total Bilirubin: 0.7 mg/dL (ref 0.3–1.2)
Total Protein: 7.6 g/dL (ref 6.5–8.1)

## 2022-10-16 LAB — CBC WITH DIFFERENTIAL/PLATELET
Abs Immature Granulocytes: 0.04 10*3/uL (ref 0.00–0.07)
Basophils Absolute: 0 10*3/uL (ref 0.0–0.1)
Basophils Relative: 0 %
Eosinophils Absolute: 0 10*3/uL (ref 0.0–0.5)
Eosinophils Relative: 1 %
HCT: 39.6 % (ref 36.0–46.0)
Hemoglobin: 13 g/dL (ref 12.0–15.0)
Immature Granulocytes: 1 %
Lymphocytes Relative: 17 %
Lymphs Abs: 1.4 10*3/uL (ref 0.7–4.0)
MCH: 29.5 pg (ref 26.0–34.0)
MCHC: 32.8 g/dL (ref 30.0–36.0)
MCV: 90 fL (ref 80.0–100.0)
Monocytes Absolute: 0.6 10*3/uL (ref 0.1–1.0)
Monocytes Relative: 6 %
Neutro Abs: 6.6 10*3/uL (ref 1.7–7.7)
Neutrophils Relative %: 75 %
Platelets: 186 10*3/uL (ref 150–400)
RBC: 4.4 MIL/uL (ref 3.87–5.11)
RDW: 12.8 % (ref 11.5–15.5)
WBC: 8.7 10*3/uL (ref 4.0–10.5)
nRBC: 0 % (ref 0.0–0.2)

## 2022-10-16 LAB — URINE DRUG SCREEN, QUALITATIVE (ARMC ONLY)
Amphetamines, Ur Screen: NOT DETECTED
Barbiturates, Ur Screen: NOT DETECTED
Benzodiazepine, Ur Scrn: NOT DETECTED
Cannabinoid 50 Ng, Ur ~~LOC~~: NOT DETECTED
Cocaine Metabolite,Ur ~~LOC~~: POSITIVE — AB
MDMA (Ecstasy)Ur Screen: NOT DETECTED
Methadone Scn, Ur: NOT DETECTED
Opiate, Ur Screen: NOT DETECTED
Phencyclidine (PCP) Ur S: NOT DETECTED
Tricyclic, Ur Screen: POSITIVE — AB

## 2022-10-16 LAB — MAGNESIUM: Magnesium: 2 mg/dL (ref 1.7–2.4)

## 2022-10-16 LAB — HIV ANTIBODY (ROUTINE TESTING W REFLEX): HIV Screen 4th Generation wRfx: NONREACTIVE

## 2022-10-16 NOTE — ED Notes (Signed)
EKG obtained, within normal limits.

## 2022-10-16 NOTE — ED Notes (Signed)
Pt given phone to make a call

## 2022-10-16 NOTE — Progress Notes (Addendum)
Progress Note   Patient: Denise Gibbs SNK:[id number] DOB: [birth year] DOA: 10/15/2022     0 DOS: the patient was seen and examined on 10/16/2022   Brief hospital course: 27 y.o. 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   Taler states that yesterday, she had a stressful day due to arguments with her fianc and her parents.  Due to this, she was feeling very overwhelmed.  Last night, she took a "handful" of Wellbutrin and believes she took extra gabapentin and Seroquel as well.  She is unsure exactly how much she took but believes she took the most of Wellbutrin.  Last night, she began to have twitching of her facial muscles.  When she awoke today, she continued to have twitching of her facial muscles at times.  Her fianc states that after some twitching of her face, she began jerking with all of her extremities and her eyes rolled.  She stopped breathing for several seconds but then began breathing spontaneous.  Angelic states she did have some urinary incontinence during this episode.  She was initially confused after the seizure-like activity, but on arrival to the ED, mentation improved.  1/22.  Patient seen and examined.  Patient states she feels well and wants to go home.  Patient was involuntary committed.  Seizure likely secondary to too much Wellbutrin.  Advised not to take Wellbutrin.  Medically cleared.  Will cancel medical observation since patient is still in the emergency room.  Will follow along.  Assessment and Plan: * Seizure ([place]) Likely secondary to much Wellbutrin.  Wellbutrin can lower your threshold for seizure.  Advised not to take any more Wellbutrin.  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  Patient not a diabetic at this point  Morbid obesity with BMI of 50.0-59.9, adult ([place]) BMI 53.25 with current height and weight in computer.  Cocaine abuse ([place]) Advised not to use cocaine        Subjective: Patient feels fine and wants to go home.  Unfortunately involuntarily committed.  Advised not to take anymore Wellbutrin because this can lessen her threshold for seizure.  Physical Exam: Vitals:   10/16/22 0401 10/16/22 0805 10/16/22 0847 10/16/22 1333  BP: 124/62 127/86  123/75  Pulse: 89 82  93  Resp: 16 18  17   Temp: 98.1 F (36.7 C) 98.2 F (36.8 C)  98.6 F (37 C)  TempSrc: Oral Oral  Oral  SpO2: 99% 92% 94% 95%  Weight:      Height:       Physical Exam HENT:     Head: Normocephalic.     Mouth/Throat:     Pharynx: No oropharyngeal exudate.  Eyes:     General: Lids are normal.     Conjunctiva/sclera: Conjunctivae normal.  Cardiovascular:     Rate and Rhythm: Normal rate and regular rhythm.     Heart sounds: Normal heart sounds, S1 normal and S2 normal.  Pulmonary:     Breath sounds: No decreased breath sounds, wheezing, rhonchi or rales.  Abdominal:     Palpations: Abdomen is soft.     Tenderness: There is no abdominal tenderness.  Musculoskeletal:     Right lower leg: Swelling present.     Left lower leg: Swelling present.  Skin:    General: Skin is warm.     Findings: No rash.  Neurological:     Mental Status: She is alert and oriented to person, place, and time.     Data Reviewed: Creatinine 0.52, sodium 138, glucose 103, hemoglobin A1c 5.0, CBC normal range  Disposition: Status is: Observation Canceled the medical admission.  Medically cleared for psychiatric treatment.  Planned Discharge Destination: As per psychiatry team   Medical team to sign off. Time spent: 30 minutes   Author: Loletha Grayer, MD 10/16/2022 3:16 PM  For on call review [email or web address].

## 2022-10-16 NOTE — ED Notes (Signed)
Spoke with poison control, and gave pt's updated vital signs and over all status. Instructed by poison control to get another EKG on pt. If EKG normal, no need to call back, if EKG abnormal, to call back poison control line.

## 2022-10-16 NOTE — ED Notes (Signed)
Pt given lunch tray.

## 2022-10-16 NOTE — ED Notes (Signed)
Pt given a breakfast tray.

## 2022-10-16 NOTE — Hospital Course (Addendum)
27 y.o. 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   Denise Gibbs states that yesterday, she had a stressful day due to arguments with her fianc and her parents.  Due to this, she was feeling very overwhelmed.  Last night, she took a "handful" of Wellbutrin and believes she took extra gabapentin and Seroquel as well.  She is unsure exactly how much she took but believes she took the most of Wellbutrin.  Last night, she began to have twitching of her facial muscles.  When she awoke today, she continued to have twitching of her facial muscles at times.  Her fianc states that after some twitching of her face, she began jerking with all of her extremities and her eyes rolled.  She stopped breathing for several seconds but then began breathing spontaneous.  Denise Gibbs states she did have some urinary incontinence during this episode.  She was initially confused after the seizure-like activity, but on arrival to the ED, mentation improved.  1/22.  Patient seen and examined.  Patient states she feels well and wants to go home.  Patient was involuntary committed.  Seizure likely secondary to too much Wellbutrin.  Advised not to take Wellbutrin.  Medically cleared. 1/23.  Patient seen by psychiatry and cleared to go home.  Patient does have a little upper airway wheeze from her tracheal stenosis and she refused any nebulizer treatments.  She does not feel sick and wanted to go home.

## 2022-10-16 NOTE — Assessment & Plan Note (Signed)
Advised not to use cocaine

## 2022-10-16 NOTE — ED Notes (Signed)
Pt has been medically cleared/ Recommend in-pt psych admit

## 2022-10-16 NOTE — ED Notes (Signed)
Pt care taken, sitting in hallway, no complaints at this time.

## 2022-10-16 NOTE — Assessment & Plan Note (Signed)
BMI 53.25 with current height and weight in computer.

## 2022-10-16 NOTE — Progress Notes (Signed)
Patient seen earlier in shift for trach assessment. Patient on room air in no distress. Has a #6 xlt shiley trach in place that she states she has had for appx a year. She states she does not use oxygen, dressing for her trach, and is self sufficient with her own trach care. She also states she does not require suctioning. Trach equipment placed at bedside and ambu bag.

## 2022-10-16 NOTE — Assessment & Plan Note (Addendum)
Patient seen by psychiatry on the day of discharge and cleared to go home.  IVC was rescinded.  Can go back on all of her usual medications except for her Wellbutrin.

## 2022-10-16 NOTE — ED Notes (Signed)
Pt given dinner tray and drink at this time. 

## 2022-10-16 NOTE — Assessment & Plan Note (Signed)
Hemoglobin A1c 5.0.  Patient not a diabetic at this point

## 2022-10-16 NOTE — ED Notes (Signed)
Patient was given a sandwhich tray, a cup of icecream and a cup of sprite.

## 2022-10-16 NOTE — ED Notes (Signed)
Multiple attempts made to collect AM labs without success. Lab called for assistance at this time

## 2022-10-16 NOTE — ED Notes (Signed)
2nd family member at pt bedside for visit

## 2022-10-16 NOTE — Progress Notes (Signed)
[place] admission since still in ER.  Patient under IVC and psych recommended inpatient psych admission on last note yesterday. Recommend holding Wellbutirin since she had a seizure with overdose.  Full note to follow later.  DR Leslye Peer

## 2022-10-16 NOTE — Progress Notes (Addendum)
Medically cleared for psychiatry disposition. No disposition as of this evening.  Dr Leslye Peer

## 2022-10-16 NOTE — ED Notes (Signed)
PT family member at bedside 

## 2022-10-16 NOTE — Consult Note (Addendum)
Follow-up with patient. Per chart review, (note of 10/15/22) Poison Control recommended medical observation for 24 hours.  Patient was assigned to hopsitalist (Dr. Leslye Peer). Dr. Leslye Peer states patient is medically cleared. Patient is awaiting placement in an inpatient psychiatric unit.   Patient is tearful, upset that she will need to stay for inpatient, states that she did not overdose in a suicide attempt, but only "to get high." Discussion with patient that she will need to be psychiatrically admitted. The Admission Coordinators in [facility] Ovid Curd, Scharlene Gloss have been advised.   Sherlon Handing, PMHNP

## 2022-10-16 NOTE — ED Notes (Signed)
IVC/INPT Admit recommended

## 2022-10-17 DIAGNOSIS — F333 Major depressive disorder, recurrent, severe with psychotic symptoms: Secondary | ICD-10-CM | POA: Diagnosis not present

## 2022-10-17 DIAGNOSIS — T50902S Poisoning by unspecified drugs, medicaments and biological substances, intentional self-harm, sequela: Secondary | ICD-10-CM

## 2022-10-17 DIAGNOSIS — J9503 Malfunction of tracheostomy stoma: Secondary | ICD-10-CM | POA: Diagnosis not present

## 2022-10-17 DIAGNOSIS — R569 Unspecified convulsions: Secondary | ICD-10-CM | POA: Diagnosis not present

## 2022-10-17 NOTE — Assessment & Plan Note (Signed)
Patient declined any suicidal or homicidal ideation to me.  Cleared by psychiatry to go home.

## 2022-10-17 NOTE — Progress Notes (Signed)
Patient brought to unit via stretcher by security and tech. No respiratory distress. Trach intact. Floor tech is in room with patient. Orientated to room. Made comfortable. Suctioning  set up. Bed padded. Sitter remain in room. No complaint of pain or other discomfort voiced except that she is tired. Patient is calm and cooperative.

## 2022-10-17 NOTE — Progress Notes (Addendum)
  Transition of Care [facility]) Screening Note   Patient Details  Name: Denise Gibbs Date of Birth: 08/05/1996   Transition of Care [facility]) CM/SW Contact:    Quin Hoop, LCSW Phone Number: 10/17/2022, 9:42 AM    Transition of Care Department [facility]) has reviewed patient awaiting IP [facility] bed. We will continue to monitor patient for continued discharge needs.

## 2022-10-17 NOTE — Discharge Summary (Signed)
Physician Discharge Summary   Patient: Denise Gibbs MRN: [id number] DOB: [birth year]  Admit date:     10/15/2022  Discharge date: 10/17/22  Discharge Physician: Alford Highland   PCP: System, Provider Not In   Recommendations at discharge:   Follow-up with your medical doctor 5 days Follow-up with your psychiatrist at [facility] in [place] 1 week.  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   Denise Gibbs states that yesterday, she had a stressful day due to arguments with her fianc and her parents.  Due to this, she was feeling very overwhelmed.  Last night, she took a "handful" of Wellbutrin and believes she took extra gabapentin and Seroquel as well.  She is unsure exactly how much she took but believes she took the most of Wellbutrin.  Last night, she began to have twitching of her facial muscles.  When she awoke today, she continued to have twitching of her facial muscles at times.  Her fianc states that after some twitching of her face, she began jerking with all of her extremities and her eyes rolled.  She stopped breathing for several seconds but then began breathing spontaneous.  Denise Gibbs states she did have some urinary incontinence during this episode.  She was initially confused after the seizure-like activity, but on arrival to the ED, mentation improved.  1/22.  Patient seen and examined.  Patient states she feels well and wants to go home.  Patient was involuntary committed.  Seizure likely secondary to too  much Wellbutrin.  Advised not to take Wellbutrin.  Medically cleared. 1/23.  Patient seen by psychiatry and cleared to go home.  Patient does have a little upper airway wheeze from her tracheal stenosis and she refused any nebulizer treatments.  She does not feel sick and wanted to go home.  Assessment and Plan: * Overdose Patient declined any suicidal or homicidal ideation to me.  Cleared by psychiatry to go home.  Case discussed with psychiatrist.  Patient has her usual medications at home.  No prescriptions were written by me.  Seizure [facility]) Wellbutrin can lower your threshold for seizure.  Patient advised not to take any more Wellbutrin.  Patient also advised not to use cocaine.  Major depressive disorder, recurrent episode, severe, with psychosis (HCC) Patient seen by psychiatry on the day of discharge and cleared to go home.  IVC was rescinded.  Can go back on all of her usual medications except for her Wellbutrin.  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  Patient declined any nebulizer treatments.  She states that she sometimes gets a little wheeze and cough.  Does not feel like she has a cold and without fever.  Impaired fasting glucose Hemoglobin A1c 5.0.  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  Cocaine abuse (HCC) Advised not to use cocaine         Consultants: Psychiatry Procedures performed: None Disposition: Home Diet recommendation:  Regular diet  DISCHARGE MEDICATION: Allergies as of 10/17/2022   No Known Allergies      Medication List     STOP taking these medications    buPROPion 300 MG 24 hr tablet Commonly known as: WELLBUTRIN XL       TAKE these medications    gabapentin 800 MG tablet Commonly known as: NEURONTIN Take 800 mg by mouth 4 (four) times daily.   paliperidone 234 MG/1.5ML  injection Commonly known as: INVEGA SUSTENNA Inject 234 mg into the muscle every 30 (thirty) days.   propranolol 20 MG tablet Commonly known as: INDERAL TAKE 2 TABLETS (40MG ) BY MOUTH ONCE DAILY AS NEEDED FOR ANXIETY.   QUEtiapine 100 MG tablet Commonly known as: SEROQUEL TAKE 1 TABLET BY MOUTH ONCE NIGHTLY.        Follow-up Information     [facility] in [facility] Follow up in 1 week(s).          your medical doctor Follow up in 5 day(s).                 Discharge Exam: Filed Weights   10/15/22 1533 10/16/22 2307  Weight: (!) 145.2 kg (!) 145.4 kg   Physical Exam HENT:     Head: Normocephalic.     Mouth/Throat:     Pharynx: No oropharyngeal exudate.  Eyes:     General: Lids are normal.     Conjunctiva/sclera: Conjunctivae normal.  Cardiovascular:     Rate and Rhythm: Normal rate and regular rhythm.     Heart sounds: Normal heart sounds, S1 normal and S2 normal.  Pulmonary:     Breath sounds: Transmitted upper airway sounds present. Examination of the right-lower field reveals wheezing. Examination of the left-lower field reveals wheezing. Wheezing present. No decreased breath sounds, rhonchi or rales.  Abdominal:     Palpations: Abdomen is soft.     Tenderness: There is no abdominal tenderness.  Musculoskeletal:     Right lower leg: Swelling present.     Left lower leg: Swelling present.  Skin:    General: Skin is warm.     Findings: No rash.  Neurological:     Mental Status: She is alert and oriented to person, place, and time.      Condition at discharge: stable  The results of significant diagnostics from this hospitalization (including imaging, microbiology, ancillary and laboratory) are listed below for reference.   Imaging Studies: CT Head Wo Contrast  Result Date: 10/15/2022 CLINICAL DATA:  Seizures EXAM: CT HEAD WITHOUT CONTRAST TECHNIQUE: Contiguous axial images were obtained from the base of the skull through the vertex without  intravenous contrast. RADIATION DOSE REDUCTION: This exam was performed according to the departmental dose-optimization program which includes automated exposure control, adjustment of the mA and/or kV according to patient size and/or use of iterative reconstruction technique. COMPARISON:  None Available. FINDINGS: Brain: No evidence of acute infarction, hemorrhage, hydrocephalus, extra-axial collection or mass lesion/mass effect. Vascular: No hyperdense vessel or unexpected calcification. Skull: No osseous abnormality. Sinuses/Orbits: Visualized paranasal sinuses are clear. Visualized mastoid sinuses are clear. Visualized orbits demonstrate no focal abnormality. Other: Multiple frontal sebaceous cyst. IMPRESSION: 1. No acute intracranial findings. Electronically Signed   By: Kathreen Devoid M.D.   On: 10/15/2022 13:13    Microbiology: No results found for this or any previous visit.  Labs: CBC: Recent Labs  Lab 10/15/22 1234 10/16/22 0624  WBC 11.6* 8.7  NEUTROABS 9.7* 6.6  HGB 14.5 13.0  HCT 44.3 39.6  MCV 91.9  90.0  PLT 196 326   Basic Metabolic Panel: Recent Labs  Lab 10/15/22 1234 10/16/22 0624  NA 138 138  K 4.2 3.8  CL 104 106  CO2 24 24  GLUCOSE 112* 103*  BUN 12 10  CREATININE 0.72 0.52  CALCIUM 9.1 9.2  MG  --  2.0   Liver Function Tests: Recent Labs  Lab 10/15/22 1234 10/16/22 0624  AST 15 10*  ALT 10 10  ALKPHOS 63 62  BILITOT 0.6 0.7  PROT 8.1 7.6  ALBUMIN 4.1 3.9   Discharge time spent: greater than 30 minutes. Case discussed with psychiatrist. Patient refused me calling any family members to give an update.  Signed: Loletha Grayer, MD [facility] 10/17/2022

## 2022-10-17 NOTE — Discharge Instructions (Signed)
No Wellbutrin No Cocaine

## 2022-10-17 NOTE — Consult Note (Signed)
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 female patient admitted with "I overdosed on some pills".  HPI: Patient seen and chart reviewed.  Previous psychiatric notes from nurse practitioners reviewed.  27 year old woman presented to the hospital on the 21st after having a seizure at home.  History was obtained that the patient admitted to having overdosed on her prescription medicine including bupropion and gabapentin.  Earliest notes in most notes from the emergency room say that the patient denied having suicidal intent.  She stated that she took the pills because she was upset and wanted to just "go away".  On interview with me the patient repeats this saying that she was upset because she had had a fight with her father.  She said that the fight was over the fact that people could not find enough weed at home.  In any case she said she was upset and wanted to just "go away".  I asked her what that means and she said she wanted to just get high and be asleep and not pay attention to things for a while.  She denied in clear terms having any thought or intention at the time of wanting to kill her self.  She denies any wish or thought of wanting to kill her self now.  Says her mood is feeling better and stable and back to normal.  Patient has chronic problems with  mood instability and anxiety but reports that she had not been having a worsening major depression prior to the episodes that brought her into the hospital.  She does admit to cocaine abuse.  Denies that she had been drinking.  Patient is requesting to be discharged home.  She follows up for regular psychiatric care with both a therapist and a medicine provider through [facility] in [place]  Past Psychiatric History: Patient has a long history of chronic mental health problems with various diagnoses including bipolar 2 depression and anxiety.  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  Also longstanding substance use problems.  Patient has had serious suicide attempts in the past.  She says she cannot remember the last time however that she really wanted to kill her self.  Risk to Self:   Risk to Others:   Prior Inpatient Therapy:   Prior Outpatient Therapy:    Past Medical History:  Past Medical History:  Diagnosis Date   Anxiety    Depression     Past Surgical History:  Procedure Laterality Date   TONSILLECTOMY     Family History:  Family History  Problem Relation Age of Onset   Hypertension Father    Hypertension Paternal Uncle    Hypertension Paternal Grandfather    Family Psychiatric  History: Denies Social History:  Social History   Substance and Sexual Activity  Alcohol Use Yes     Social History   Substance and Sexual  Activity  Drug Use Yes   Types: Marijuana    Social History   Socioeconomic History   Marital status: Single    Spouse name: Not on file   Number of children: Not on file   Years of education: Not on file   Highest education level: Not on file  Occupational History   Not on file  Tobacco Use   Smoking status: Every Day    Packs/day: 0.50    Types: Cigarettes   Smokeless tobacco: Never  Substance and Sexual Activity   Alcohol use: Yes   Drug use: Yes    Types: Marijuana   Sexual activity: Not on file  Other  Topics Concern   Not on file  Social History Narrative   Not on file   Social Determinants of Health   Financial Resource Strain: Not on file  Food Insecurity: Food Insecurity Present (10/16/2022)   Hunger Vital Sign    Worried About Running Out of Food in the Last Year: Often true    Ran Out of Food in the Last Year: Often true  Transportation Needs: No Transportation Needs (10/16/2022)   PRAPARE - Administrator, Civil Service (Medical): No    Lack of Transportation (Non-Medical): No  Physical Activity: Not on file  Stress: Not on file  Social Connections: Not on file   Additional Social History:    Allergies:  No Known Allergies  Labs:  Results for orders placed or performed during the hospital encounter of 10/15/22 (from the past 48 hour(s))  Acetaminophen level     Status: Abnormal   Collection Time: 10/15/22  8:15 PM  Result Value Ref Range   Acetaminophen (Tylenol), Serum <10 (L) 10 - 30 ug/mL    Comment: (NOTE) Therapeutic concentrations vary significantly. A range of 10-30 ug/mL  may be an effective concentration for many patients. However, some  are best treated at concentrations outside of this range. Acetaminophen concentrations >150 ug/mL at 4 hours after ingestion  and >50 ug/mL at 12 hours after ingestion are often associated with  toxic reactions.  Performed at [facility], [address] [place], [place] [address]   HIV Antibody (routine testing w rflx)     Status: None   Collection Time: 10/16/22  6:24 AM  Result Value Ref Range   HIV Screen 4th Generation wRfx Non Reactive Non Reactive    Comment: Performed at [facility], [address], [place], [place] [address]  Comprehensive metabolic panel     Status: Abnormal   Collection Time: 10/16/22  6:24 AM  Result Value Ref Range   Sodium 138 135 - 145 mmol/L   Potassium 3.8 3.5 - 5.1 mmol/L   Chloride 106 98 - 111 mmol/L   CO2 24 22 - 32 mmol/L   Glucose, Bld 103 (H) 70  - 99 mg/dL    Comment: Glucose reference range applies only to samples taken after fasting for at least 8 hours.   BUN 10 6 - 20 mg/dL   Creatinine, Ser 2.58 0.44 - 1.00 mg/dL   Calcium 9.2 8.9 - 52.7 mg/dL   Total Protein 7.6 6.5 - 8.1 g/dL   Albumin 3.9 3.5 - 5.0 g/dL   AST 10 (L) 15 - 41 U/L   ALT 10 0 - 44 U/L   Alkaline Phosphatase 62 38 - 126 U/L   Total Bilirubin 0.7 0.3 - 1.2 mg/dL   GFR, Estimated >78 >24 mL/min    Comment: (NOTE)  Calculated using the CKD-EPI Creatinine Equation (2021)    Anion gap 8 5 - 15    Comment: Performed at [facility], [address] [place], [place] [address]  Magnesium     Status: None   Collection Time: 10/16/22  6:24 AM  Result Value Ref Range   Magnesium 2.0 1.7 - 2.4 mg/dL    Comment: Performed at [facility], [address] [place], [place] [address]  CBC with Differential/Platelet     Status: None   Collection Time: 10/16/22  6:24 AM  Result Value Ref Range   WBC 8.7 4.0 - 10.5 K/uL   RBC 4.40 3.87 - 5.11 MIL/uL   Hemoglobin 13.0 12.0 - 15.0 g/dL   HCT 39.6 36.0 - 46.0 %   MCV 90.0 80.0 - 100.0 fL   MCH 29.5 26.0 - 34.0 pg   MCHC 32.8 30.0 - 36.0 g/dL   RDW 12.8 11.5 - 15.5 %   Platelets 186 150 - 400 K/uL   nRBC 0.0 0.0 - 0.2 %   Neutrophils Relative % 75 %   Neutro Abs 6.6 1.7 - 7.7 K/uL   Lymphocytes Relative 17 %   Lymphs Abs 1.4 0.7 - 4.0 K/uL   Monocytes Relative 6 %   Monocytes Absolute 0.6 0.1 - 1.0 K/uL   Eosinophils Relative 1 %   Eosinophils Absolute 0.0 0.0 - 0.5 K/uL   Basophils Relative 0 %   Basophils Absolute 0.0 0.0 - 0.1 K/uL   Immature Granulocytes 1 %   Abs Immature Granulocytes 0.04 0.00 - 0.07 K/uL    Comment: Performed at [facility], [address], [place], [place] [address]    Current Facility-Administered Medications  Medication Dose Route Frequency Provider Last Rate Last Admin   LORazepam (ATIVAN) injection 2 mg  2 mg Intravenous Q5 Min x 2 PRN Jose Persia, MD       LORazepam (ATIVAN) tablet 0.5 mg  0.5 mg Oral BID PRN Patrecia Pour, NP       ondansetron [facility]) tablet 4 mg  4 mg Oral Q6H PRN Jose Persia, MD       Or   ondansetron (ZOFRAN) injection 4 mg  4 mg Intravenous Q6H PRN Jose Persia, MD       Oral care mouth rinse  15 mL Mouth Rinse PRN Jose Persia, MD        Musculoskeletal: Strength & Muscle Tone: within normal limits Gait & Station: normal Patient leans: N/A            Psychiatric Specialty Exam:  Presentation  General Appearance: No data recorded Eye Contact:No data recorded Speech:No data recorded Speech Volume:No data recorded Handedness:No data recorded  Mood and Affect  Mood:No data recorded Affect:No data recorded  Thought Process  Thought Processes:No data recorded Descriptions of Associations:No data recorded Orientation:No data recorded Thought Content:No data recorded History of Schizophrenia/Schizoaffective disorder:No data recorded Duration of Psychotic Symptoms:No data recorded Hallucinations:No data recorded Ideas of Reference:No data recorded Suicidal Thoughts:No data recorded Homicidal Thoughts:No data recorded  Sensorium  Memory:No data recorded Judgment:No data recorded Insight:No data recorded  Executive Functions  Concentration:No data recorded Attention Span:No data recorded Recall:No data recorded Fund of Knowledge:No data recorded Language:No data recorded  Psychomotor Activity  Psychomotor Activity:No data recorded  Assets  Assets:No data recorded  Sleep  Sleep:No data recorded  Physical Exam: Physical Exam Vitals and nursing note reviewed.  Constitutional:      Appearance: Normal appearance.  HENT:  Head: Normocephalic and atraumatic.     Mouth/Throat:     Pharynx: Oropharynx is clear.  Eyes:     Pupils: Pupils are equal, round, and reactive to light.  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  Pulmonary:     Effort: Pulmonary effort is normal.     Breath sounds: Normal breath sounds.  Abdominal:     General: Abdomen is flat.     Palpations: Abdomen is soft.  Musculoskeletal:        General: Normal range of motion.  Skin:    General: Skin is warm and dry.  Neurological:     General: No focal deficit present.     Mental Status: She is alert. Mental status is at baseline.  Psychiatric:        Attention and Perception: Attention normal.        Mood and Affect: Mood normal.        Speech: Speech normal.        Behavior: Behavior normal.        Thought Content: Thought content normal.        Cognition and Memory: Cognition normal.        Judgment: Judgment normal.    Review of Systems  Constitutional: Negative.   HENT: Negative.    Eyes: Negative.   Respiratory: Negative.    Cardiovascular: Negative.   Gastrointestinal: Negative.   Musculoskeletal: Negative.   Skin: Negative.   Neurological: Negative.   Psychiatric/Behavioral: Negative.     Blood pressure 128/89, pulse 79, temperature 98.1 F (36.7 C), resp. rate 18, height 5\' 5"  (1.651 m), weight (!) 145.4 kg, SpO2 95 %. Body mass index is 53.34 kg/m.  Treatment Plan Summary: Plan 27 year old woman with longstanding chronic mental health problems.  Currently she appears to be back to her baseline mental state.  She is calm and lucid.  No sign of psychosis.  No delirium.  Able to answer questions appropriately.  Appropriate affect in response to questions.  Shows some reasonable regret for overdosing.  Although with her chronic problems they are always going to be some risks of overdose, at this point she does not meet commitment criteria and does not appear to be at an elevated risk of self-harm.  I have recommended that she not restart the Wellbutrin for now but follow-up with her outpatient providers and discuss this episode with him  before restarting medicine.  Patient is agreeable to the plan.  Case reviewed with treatment team in the hospital.  IVC can be discontinued.  Disposition: No evidence of imminent risk to self or others at present.   Patient does not meet criteria for psychiatric inpatient admission. Supportive therapy provided about ongoing stressors.  34, MD 10/17/2022 4:04 PM

## 2023-04-12 ENCOUNTER — Emergency Department
Admission: EM | Admit: 2023-04-12 | Discharge: 2023-04-12 | Disposition: A | Discharge status: Home / Self Care | Payer: MEDICAID | Attending: Emergency Medicine | Admitting: Emergency Medicine

## 2023-04-12 ENCOUNTER — Emergency Department: Payer: MEDICAID

## 2023-04-12 ENCOUNTER — Other Ambulatory Visit: Payer: Self-pay

## 2023-04-12 DIAGNOSIS — J029 Acute pharyngitis, unspecified: Secondary | ICD-10-CM

## 2023-04-12 DIAGNOSIS — R09A2 Foreign body sensation, throat: Secondary | ICD-10-CM | POA: Diagnosis present

## 2023-04-12 LAB — COMPREHENSIVE METABOLIC PANEL
ALT: 13 U/L (ref 0–44)
AST: 14 U/L — ABNORMAL LOW (ref 15–41)
Albumin: 3.8 g/dL (ref 3.5–5.0)
Alkaline Phosphatase: 47 U/L (ref 38–126)
Anion gap: 8 (ref 5–15)
BUN: 11 mg/dL (ref 6–20)
CO2: 25 mmol/L (ref 22–32)
Calcium: 8.9 mg/dL (ref 8.9–10.3)
Chloride: 107 mmol/L (ref 98–111)
Creatinine, Ser: 0.64 mg/dL (ref 0.44–1.00)
GFR, Estimated: >60 mL/min (ref >60)
Glucose, Bld: 111 mg/dL — ABNORMAL HIGH (ref 70–99)
Potassium: 3.4 mmol/L — ABNORMAL LOW (ref 3.5–5.1)
Sodium: 140 mmol/L (ref 135–145)
Total Bilirubin: 0.6 mg/dL (ref 0.3–1.2)
Total Protein: 7.1 g/dL (ref 6.5–8.1)

## 2023-04-12 LAB — CBC
HCT: 41.7 % (ref 36.0–46.0)
Hemoglobin: 13.7 g/dL (ref 12.0–15.0)
MCH: 29.6 pg (ref 26.0–34.0)
MCHC: 32.9 g/dL (ref 30.0–36.0)
MCV: 90.1 fL (ref 80.0–100.0)
Platelets: 190 10*3/uL (ref 150–400)
RBC: 4.63 MIL/uL (ref 3.87–5.11)
RDW: 13.9 % (ref 11.5–15.5)
WBC: 11 10*3/uL — ABNORMAL HIGH (ref 4.0–10.5)
nRBC: 0 % (ref 0.0–0.2)

## 2023-04-12 MED ORDER — PREDNISONE 20 MG PO TABS: 40.0000 mg | ORAL_TABLET | Freq: Every day | ORAL | 0 refills | Status: AC

## 2023-04-12 MED ORDER — DEXAMETHASONE SODIUM PHOSPHATE 10 MG/ML IJ SOLN
10.0000 mg | Freq: Once | INTRAMUSCULAR | Status: AC
  Administered 2023-04-12: 10 mg via INTRAVENOUS
  Filled 2023-04-12: qty 1

## 2023-04-12 MED ORDER — IOHEXOL 300 MG/ML  SOLN
75.0000 mL | Freq: Once | INTRAMUSCULAR | Status: AC | PRN
  Administered 2023-04-12: 75 mL via INTRAVENOUS

## 2023-04-12 NOTE — ED Provider Notes (Signed)
[facility] Provider Note    Event Date/Time   First MD Initiated Contact with Patient 04/12/23 1156     (approximate)  History   Chief Complaint: Foreign Body  HPI  Denise Gibbs is a 27 y.o. 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  According to the patient for the last 2 days she states it has felt like something is stuck in her throat just above her tracheostomy.  Patient denies any trouble breathing or any increased sputum production.  States she has been coughing trying to clear the obstruction above the tracheostomy.  Physical Exam   Triage Vital Signs: ED Triage Vitals  Encounter Vitals Group     BP 04/12/23 1101 134/65     Systolic BP Percentile --      Diastolic BP Percentile --      Pulse Rate 04/12/23 1101 93     Resp 04/12/23 1101 20     Temp 04/12/23 1101 98.4 F (36.9 C)     Temp Source 04/12/23 1101 Oral     SpO2 04/12/23 1101 93 %     Weight 04/12/23 1107 (!) 320 lb (145.2 kg)     Height 04/12/23 1107 5\' 5"  (1.651 m)     Head Circumference --      Peak Flow --      Pain Score 04/12/23 1107 3     Pain Loc --      Pain Education --      Exclude from Growth Chart --     Most recent vital signs: Vitals:   04/12/23 1101  BP: 134/65  Pulse: 93  Resp: 20  Temp: 98.4 F (36.9 C)  SpO2: 93%    General: Awake, no distress.  CV:  Good peripheral perfusion.  Regular rate and rhythm  Resp:  Normal effort.  Equal breath sounds bilaterally.  Rales or rhonchi.  No significant tracheostomy sputum. Abd:  No distention.  Soft, nontender.  No rebound or guarding.   ED Results / Procedures / Treatments   RADIOLOGY  I have reviewed and interpreted chest x-ray images.  Tracheostomy appears appropriately placed no obvious consolidation of the lungs. Radiology is read the chest x-ray is negative for  acute abnormality. Right of the neck shows midline tracheostomy with narrowing of the supraglottic larynx   MEDICATIONS ORDERED IN ED: Medications - No data to display   IMPRESSION / MDM / ASSESSMENT AND PLAN / ED COURSE  I reviewed the triage vital signs and the nursing notes.  Patient's presentation is most consistent with acute presentation with potential threat to life or bodily function.  Patient presents to the emergency department for foreign body sensation above her tracheostomy.  Patient states it feels like something is stuck there she has been trying to cough to loosen what ever is stuck there.  Denies any trouble breathing.  Clear lung sounds.  Reassuring chest x-ray.  Neck x-ray does show narrowing of the supraglottic space is not clear if this is chronic or possibly due to to inflammation/infection/foreign body.  Will proceed with labs and a CT scan with contrast of the neck to further evaluate.  Patient agreeable to plan of care.  No distress.  Patient CT scan has resulted showing no acute abnormality no findings to explain the foreign body sensation.  Given the patient's otherwise reassuring workup we will place patient on  steroids to reduce possible inflammation to this area and then have her follow-up with ENT for consideration of direct visualization.  Patient has a trach no concern for airway compromise.  I believe the patient will be safe to follow-up as an outpatient.  Patient agreeable to plan of care and workup.  FINAL CLINICAL IMPRESSION(S) / ED DIAGNOSES   Foreign body sensation in throat   Note:  This document was prepared using Dragon voice recognition software and may include unintentional dictation errors.   Minna Antis, MD 04/12/23 1452

## 2023-04-12 NOTE — Discharge Instructions (Signed)
Please take your steroids as prescribed for the next 5 days.  Please call the number provided for ENT to arrange follow-up as soon as possible.  Return to the emergency department for any worsening symptoms or any other symptom personally concerning to yourself.

## 2023-04-12 NOTE — ED Triage Notes (Signed)
Pt to ED for possible foreign body "covering trach valve" for the past 3 days. Reports making it harder to speak and soreness. RR even and unlabored

## 2023-04-30 ENCOUNTER — Encounter: Payer: Self-pay | Admitting: Emergency Medicine

## 2023-04-30 ENCOUNTER — Emergency Department: Payer: MEDICAID

## 2023-04-30 ENCOUNTER — Other Ambulatory Visit: Payer: Self-pay

## 2023-04-30 ENCOUNTER — Emergency Department
Admission: EM | Admit: 2023-04-30 | Discharge: 2023-05-07 | Disposition: A | Discharge status: Home / Self Care | Payer: MEDICAID | Attending: Emergency Medicine | Admitting: Emergency Medicine

## 2023-04-30 DIAGNOSIS — T50902A Poisoning by unspecified drugs, medicaments and biological substances, intentional self-harm, initial encounter: Secondary | ICD-10-CM | POA: Diagnosis present

## 2023-04-30 DIAGNOSIS — X838XXA Intentional self-harm by other specified means, initial encounter: Secondary | ICD-10-CM | POA: Insufficient documentation

## 2023-04-30 DIAGNOSIS — R464 Slowness and poor responsiveness: Secondary | ICD-10-CM | POA: Diagnosis not present

## 2023-04-30 DIAGNOSIS — F1410 Cocaine abuse, uncomplicated: Secondary | ICD-10-CM | POA: Diagnosis present

## 2023-04-30 DIAGNOSIS — F332 Major depressive disorder, recurrent severe without psychotic features: Secondary | ICD-10-CM | POA: Diagnosis not present

## 2023-04-30 DIAGNOSIS — F1220 Cannabis dependence, uncomplicated: Secondary | ICD-10-CM | POA: Diagnosis present

## 2023-04-30 DIAGNOSIS — F17210 Nicotine dependence, cigarettes, uncomplicated: Secondary | ICD-10-CM | POA: Diagnosis not present

## 2023-04-30 DIAGNOSIS — F603 Borderline personality disorder: Secondary | ICD-10-CM | POA: Diagnosis present

## 2023-04-30 DIAGNOSIS — F32A Depression, unspecified: Secondary | ICD-10-CM | POA: Insufficient documentation

## 2023-04-30 DIAGNOSIS — T1491XA Suicide attempt, initial encounter: Secondary | ICD-10-CM | POA: Insufficient documentation

## 2023-04-30 DIAGNOSIS — Z1152 Encounter for screening for COVID-19: Secondary | ICD-10-CM | POA: Diagnosis not present

## 2023-04-30 LAB — BLOOD GAS, VENOUS
Acid-base deficit: 0.1 mmol/L (ref 0.0–2.0)
Bicarbonate: 26.8 mmol/L (ref 20.0–28.0)
O2 Saturation: 42.8 %
Patient temperature: 37
pCO2, Ven: 52 mmHg (ref 44–60)
pH, Ven: 7.32 (ref 7.25–7.43)
pO2, Ven: <31 mmHg — CL (ref 32–45)

## 2023-04-30 LAB — ETHANOL: Alcohol, Ethyl (B): 115 mg/dL — ABNORMAL HIGH (ref <10)

## 2023-04-30 LAB — COMPREHENSIVE METABOLIC PANEL
ALT: 12 U/L (ref 0–44)
AST: 10 U/L — ABNORMAL LOW (ref 15–41)
Albumin: 3.8 g/dL (ref 3.5–5.0)
Alkaline Phosphatase: 49 U/L (ref 38–126)
Anion gap: 10 (ref 5–15)
BUN: 6 mg/dL (ref 6–20)
CO2: 24 mmol/L (ref 22–32)
Calcium: 8.9 mg/dL (ref 8.9–10.3)
Chloride: 105 mmol/L (ref 98–111)
Creatinine, Ser: 0.55 mg/dL (ref 0.44–1.00)
GFR, Estimated: >60 mL/min (ref >60)
Glucose, Bld: 93 mg/dL (ref 70–99)
Potassium: 3.3 mmol/L — ABNORMAL LOW (ref 3.5–5.1)
Sodium: 139 mmol/L (ref 135–145)
Total Bilirubin: 0.3 mg/dL (ref 0.3–1.2)
Total Protein: 7.4 g/dL (ref 6.5–8.1)

## 2023-04-30 LAB — HCG, QUANTITATIVE, PREGNANCY: hCG, Beta Chain, Quant, S: <1 m[IU]/mL (ref <5)

## 2023-04-30 LAB — ACETAMINOPHEN LEVEL: Acetaminophen (Tylenol), Serum: <10 ug/mL — ABNORMAL LOW (ref 10–30)

## 2023-04-30 LAB — CBC
HCT: 40.8 % (ref 36.0–46.0)
Hemoglobin: 13.2 g/dL (ref 12.0–15.0)
MCH: 29.3 pg (ref 26.0–34.0)
MCHC: 32.4 g/dL (ref 30.0–36.0)
MCV: 90.7 fL (ref 80.0–100.0)
Platelets: 200 10*3/uL (ref 150–400)
RBC: 4.5 MIL/uL (ref 3.87–5.11)
RDW: 14 % (ref 11.5–15.5)
WBC: 10.7 10*3/uL — ABNORMAL HIGH (ref 4.0–10.5)
nRBC: 0 % (ref 0.0–0.2)

## 2023-04-30 LAB — LIPASE, BLOOD: Lipase: 24 U/L (ref 11–51)

## 2023-04-30 LAB — SALICYLATE LEVEL: Salicylate Lvl: <7 mg/dL — ABNORMAL LOW (ref 7.0–30.0)

## 2023-04-30 MED ORDER — HALOPERIDOL LACTATE 5 MG/ML IJ SOLN: 2.0000 mg | Freq: Once | INTRAMUSCULAR | Status: DC

## 2023-04-30 MED ORDER — MIDAZOLAM HCL 2 MG/2ML IJ SOLN: 2.0000 mg | Freq: Once | INTRAMUSCULAR | Status: DC

## 2023-04-30 NOTE — ED Notes (Signed)
Patient threw a piece of her tracheostomy across the floor at this time. Patient made aware that it is inappropriate to throw things at the staff or in the room.

## 2023-04-30 NOTE — ED Notes (Signed)
Patient Belongings: (stored in quad area at this time) -Black shirt -Black sports bra -Blue pants -Various jewelry

## 2023-04-30 NOTE — ED Notes (Signed)
IVC PENDING  CONSULT ?

## 2023-04-30 NOTE — ED Notes (Signed)
Poison control called prior to pt arrival. Pt took unk amt of gabapentin and drank 1 pint of etoh. Unsure when ingestion occurred.  Provide supportive care for possible CNS and Resp depression and possible hypotension.  NEED: tylenol, salicylate, ETOH, CMP, Mag levels and 6hr minimum observation

## 2023-04-30 NOTE — Progress Notes (Signed)
Pt breathing well on room air satting 94% with clear/diminished breath sounds. Changed patient's trach ties per patient request.

## 2023-04-30 NOTE — ED Provider Notes (Signed)
[facility] Provider Note    Event Date/Time   First MD Initiated Contact with Patient 04/30/23 1717     (approximate)   History   Suicide attempt  HPI  Roschell Hermon is a 27 y.o. female with complicated medical history of psychiatric illness as well as tracheostomy and tracheal stenosis  Patient evidently reportedly to have consumed up to roughly 30 g of gabapentin and alcohol earlier this morning.  Evidently was found unresponsive.  EMS reports responsive on their arrival and patient agitated, initially refusing transport, but eventually allowed transport after being placed under IVC by law enforcement  She reports she does not want to live.  She reports she hates being in the hospital.  She is very verbally aggressive and hostile towards staff    Noted in [facility] notes that the patient has a history of multilevel tracheal stenosis including to about the level of the tracheostomy site.  Physical Exam   Triage Vital Signs: ED Triage Vitals  Encounter Vitals Group     BP      Systolic BP Percentile      Diastolic BP Percentile      Pulse      Resp      Temp      Temp src      SpO2      Weight      Height      Head Circumference      Peak Flow      Pain Score      Pain Loc      Pain Education      Exclude from Growth Chart     Most recent vital signs: Vitals:   04/30/23 2230 04/30/23 2330  BP: 132/69 121/70  Pulse: 93 91  Resp: (!) 25 (!) 22  Temp:    SpO2: 90% 94%     General: Awake, no distress except she is frequently tearful and crying. Tracheostomy in place.  She is able to utilize speaking valve off-and-on without difficulty. CV:  Good peripheral perfusion.  Mild tachycardia Resp:  Normal effort.  Primarily over calm respirations, but tachypnea when she is crying and upset, occasional small amounts of white frothy sputum from her tracheostomy site which she is handling without difficulty at this time.  Normal work of  breathing primarily.  No noted rales or crackles Abd:  No distention.  Other:  No seizure-like activity.  She is awake alert oriented.  No noted respiratory or mental status depression   ED Results / Procedures / Treatments   Labs (all labs ordered are listed, but only abnormal results are displayed) Labs Reviewed  CBC - Abnormal; Notable for the following components:      Result Value   WBC 10.7 (*)    All other components within normal limits  COMPREHENSIVE METABOLIC PANEL - Abnormal; Notable for the following components:   Potassium 3.3 (*)    AST 10 (*)    All other components within normal limits  ACETAMINOPHEN LEVEL - Abnormal; Notable for the following components:   Acetaminophen (Tylenol), Serum <10 (*)    All other components within normal limits  SALICYLATE LEVEL - Abnormal; Notable for the following components:   Salicylate Lvl <7.0 (*)    All other components within normal limits  ETHANOL - Abnormal; Notable for the following components:   Alcohol, Ethyl (B) 115 (*)    All other components within normal limits  BLOOD GAS, VENOUS - Abnormal;  Notable for the following components:   pO2, Ven <31 (*)    All other components within normal limits  LIPASE, BLOOD  HCG, QUANTITATIVE, PREGNANCY  URINE DRUG SCREEN, QUALITATIVE ([facility] ONLY)     EKG  Interpreted by me at 1710 heart rate 100 QRS 110 QTc 430 Sinus tachycardia.  No evidence of acute ischemia.  Some baseline wander There is no QT prolongation.  There is no widening the QRS complex   RADIOLOGY X-ray interpreted by me is negative for acute finding or infiltrate  DG Chest Portable 1 View  Result Date: 04/30/2023 CLINICAL DATA:  Hypoxia. EXAM: PORTABLE CHEST 1 VIEW COMPARISON:  April 12, 2023 FINDINGS: There is stable tracheostomy tube positioning. The heart size and mediastinal contours are within normal limits. There is no evidence of an acute infiltrate, pleural effusion or pneumothorax. The visualized skeletal  structures are unremarkable. IMPRESSION: No active cardiopulmonary disease. Electronically Signed   By: Aram Candela M.D.   On: 04/30/2023 19:28      PROCEDURES:  Critical Care performed: No  Procedures   MEDICATIONS ORDERED IN ED: Medications - No data to display   IMPRESSION / MDM / ASSESSMENT AND PLAN / ED COURSE  I reviewed the triage vital signs and the nursing notes.                              Differential diagnosis includes, but is not limited to, probable suicidal or intentional overdose attempt, she reports taking medication and alcohol in attempt to harm herself.  Additionally she has more of a medically complicated picture and the fact that she has a tracheostomy tube history of tracheal stenosis but overall appears to be without acute distress or extremis.  Initially had some very mild hypoxia  Patient's presentation is most consistent with acute complicated illness / injury requiring diagnostic workup.  Patient under involuntary commitment.  Labs notable for normal competence of metabolic panel except for mild hypokalemia.  Very slight leukocytosis.  Acetaminophen and salicylate levels normal.  Elevated alcohol 115.  The patient is on the cardiac monitor to evaluate for evidence of arrhythmia and/or significant heart rate changes. Clinical Course as of 05/01/23 Harrell Gave Apr 30, 2023  1919 Patient awake alert oriented, much more compliant with care request at this time. [MQ]    Clinical Course User Index [MQ] Sharyn Creamer, MD   ----------------------------------------- 8:49 PM on 04/30/2023 ----------------------------------------- Patient has been observed for several hours.  Discussed with Danelle Earthly from [facility] poison control, reviewed current vital signs, laboratory workup to this point, and [place] Poison control advises the patient is "cleared" from the poison control standpoint at this time.  Patient is alert much more compliant with care needs, resting comfortably  without acute distress noted.  Vital signs have all normalized at this time.  She appears to be much improved.  Vitals:   04/30/23 2230 04/30/23 2330  BP: 132/69 121/70  Pulse: 93 91  Resp: (!) 25 (!) 22  Temp:    SpO2: 90% 94%   Plan to withhold patient's home medications at this point given recent proximity to overdose.  Awaiting psychiatric consultation at this time.  Patient remained under IVC  This juncture, the patient seems reasonably cleared for psychiatric consultation.  Will continue to monitor her while here in the emergency department but appears to have cleared her overdose.  Respiratory therapy has attended to her needs as needed for tracheostomy care  Ongoing care assigned to Dr. Elige Radon her.  Patient resting comfortably, currently awaiting recommendations from psychiatry consult.  At this juncture, patient is felt to be medically cleared for psychiatric evaluation but will continue to be observed by ED staff as we await final recommendations from psychiatry.  The patient has been placed in psychiatric observation due to the need to provide a safe environment for the patient while obtaining psychiatric consultation and evaluation, as well as ongoing medical and medication management to treat the patient's condition.  The patient has been placed under full IVC at this time.   FINAL CLINICAL IMPRESSION(S) / ED DIAGNOSES   Final diagnoses:  Overdose, intentional self-harm, initial encounter (HCC)     Rx / DC Orders   ED Discharge Orders     None        Note:  This document was prepared using Dragon voice recognition software and may include unintentional dictation errors.   Sharyn Creamer, MD 05/01/23 [phone]

## 2023-04-30 NOTE — ED Triage Notes (Signed)
Pt arrived to ED via ems after allegedly taking 33 tablets of 800 mg gabapentin in addition to ETOH intake earlier this morning . Pt found by her significant other approx 1 hour ago. Poison control and EMS called; Pt was said to be unresponsive with blue lips. Pt alert and agitated upon arrival.

## 2023-04-30 NOTE — BH Assessment (Signed)
Patient is difficult to arouse and too lethargic to participate in assessment. Psych will continue to follow-up once patient is coherent and medically cleared.

## 2023-04-30 NOTE — ED Notes (Signed)
Unable to get two studs from patients Right and Left ear. Patient and staff attempted.

## 2023-04-30 NOTE — ED Notes (Signed)
IV team at bedside 

## 2023-05-01 ENCOUNTER — Encounter: Payer: Self-pay | Admitting: Psychiatry

## 2023-05-01 ENCOUNTER — Emergency Department: Payer: MEDICAID

## 2023-05-01 DIAGNOSIS — T50902A Poisoning by unspecified drugs, medicaments and biological substances, intentional self-harm, initial encounter: Secondary | ICD-10-CM

## 2023-05-01 LAB — RESP PANEL BY RT-PCR (RSV, FLU A&B, COVID)  RVPGX2
Influenza A by PCR: NEGATIVE
Influenza B by PCR: NEGATIVE
Resp Syncytial Virus by PCR: NEGATIVE
SARS Coronavirus 2 by RT PCR: NEGATIVE

## 2023-05-01 LAB — D-DIMER, QUANTITATIVE: D-Dimer, Quant: 0.6 ug/mL-FEU — ABNORMAL HIGH (ref 0.00–0.50)

## 2023-05-01 MED ORDER — IOHEXOL 350 MG/ML SOLN
100.0000 mL | Freq: Once | INTRAVENOUS | Status: AC | PRN
  Administered 2023-05-01: 100 mL via INTRAVENOUS

## 2023-05-01 NOTE — ED Notes (Signed)
Dinner meal tray given to patient at this time.

## 2023-05-01 NOTE — ED Notes (Addendum)
Pt's O2 level 88% on RA, per pt she does not wear O2 at home. Mask on trach at this time that does not look like a trach collar attached to 3L [place]. RT called at this time.  Dr. Well, EDP made aware at this time. See new orders

## 2023-05-01 NOTE — ED Provider Notes (Signed)
Emergency Medicine Observation Re-evaluation Note  Denise Gibbs is a 27 y.o. female, seen on rounds today.  Pt initially presented to the ED for complaints of Suicide Attempt  Currently, the patient is resting comfortably.  Physical Exam  BP (!) 141/83   Pulse 91   Temp 97.6 F (36.4 C) (Axillary)   Resp (!) 21   Ht 5\' 5"  (1.651 m)   Wt (!) 145.2 kg   LMP  (LMP Unknown)   SpO2 94%   BMI 53.25 kg/m  General: No acute distress Cardiac: Well-perfused extremities Lungs: No respiratory distress Psych: Appropriate mood and affect  ED Course / MDM  EKG:EKG Interpretation Date/Time:  Monday April 30 2023 17:11:23 EDT Ventricular Rate:  99 PR Interval:  199 QRS Duration:  107 QT Interval:  340 QTC Calculation: 439 R Axis:   103  Text Interpretation: Sinus tachycardia Borderline prolonged PR interval Borderline right axis deviation Nonspecific repol abnormality, inferior leads Baseline wander in lead(s) V1 Confirmed by UNCONFIRMED, DOCTOR ([id number]), editor Lonell Face ([id number]) on 05/01/2023 7:25:05 AM  I have reviewed the labs performed to date as well as medications administered while in observation.  Recent changes in the last 24 hours include none.  Plan  Current plan is for placement.   Merwyn Katos, MD 05/01/23 [phone]

## 2023-05-01 NOTE — Consult Note (Signed)
[facility] Face-to-Face Psychiatry Consult   Reason for Consult:  Suicide Attempt Referring Physician:  Sharyn Creamer MD Patient Identification: Denise Gibbs MRN:  [id number] Principal Diagnosis: <principal problem not specified> Diagnosis:  Active Problems:   Overdose, intentional self-harm, initial encounter [facility])   Total Time spent with patient: 20 minutes  Subjective:   Denise Gibbs is a 27 y.o. female patient seen for psychiatric evaluation following intentional overdose. "I thought he was gone for good".  HPI:  Denise Gibbs is a 27 y.o. female patient seen for psychiattric evaluation following intentional overdose. Patient reports getting overwhelmed by her fiance's drug use, causing them to have a verbal altercation and him walking out. Patient reports "I thought he was gone for get and I got emotional". Patient admits to having a pint of vodka but unable to determine if she drank the entire bottle. She reports taking approx 30 gabapentin capsules. She reports being established with [facility] and visited her provider, Alveria Apley, before her intentional overdose. She reports waking up to EMS being present and being transported to the ED. Patient s A&O x 4. Patient noted lying in the hospital bed and tearful. She was willing to engage, stating I can't stay here, I have PTSD from being trapped in places like this. Patient requesting to discharge and not stay inpatient. She reports that she is no longer experiencing suicidal ideations. She denies HI. Patient reports experiencing visual hallucinations at times, stating she sees shadows of people. Patient reports last experiencing visual hallucinations yesterday.  Past Psychiatric History: Cocaine abuse, opioid use disorder, cannabis use disorder, borderline traits, alcohol abuse, MDD severe with psychosis  Risk to Self:   Risk to Others:   Prior Inpatient Therapy:   Prior Outpatient Therapy:    Past  Medical History:  Past Medical History:  Diagnosis Date   Anxiety    Depression     Past Surgical History:  Procedure Laterality Date   TONSILLECTOMY     TRACHEOSTOMY     Family History:  Family History  Problem Relation Age of Onset   Hypertension Father    Hypertension Paternal Uncle    Hypertension Paternal Grandfather    Family Psychiatric  History: n/a Social History:  Social History   Substance and Sexual Activity  Alcohol Use Yes     Social History   Substance and Sexual Activity  Drug Use Not Currently   Types: Marijuana    Social History   Socioeconomic History   Marital status: Single    Spouse name: Not on file   Number of children: Not on file   Years of education: Not on file   Highest education level: Not on file  Occupational History   Not on file  Tobacco Use   Smoking status: Every Day    Current packs/day: 0.50    Types: Cigarettes   Smokeless tobacco: Never  Substance and Sexual Activity   Alcohol use: Yes   Drug use: Not Currently    Types: Marijuana   Sexual activity: Not on file  Other Topics Concern   Not on file  Social History Narrative   Not on file   Social Determinants of Health   Financial Resource Strain: Low Risk  (11/14/2021)   Received from [facility]   Overall Financial Resource Strain (CARDIA)    Difficulty of Paying Living Expenses: Not hard at all  Food Insecurity: Food Insecurity Present (10/16/2022)   Hunger Vital Sign  Worried About Programme researcher, broadcasting/film/video in the Last Year: Often true    [employer] of Food in the Last Year: Often true  Transportation Needs: No Transportation Needs (10/16/2022)   PRAPARE - Administrator, Civil Service (Medical): No    Lack of Transportation (Non-Medical): No  Physical Activity: Not on file  Stress: Not on file  Social Connections: Not on file   Additional Social History:    Allergies:  No Known Allergies  Labs:  Results for orders placed or  performed during the hospital encounter of 04/30/23 (from the past 48 hour(s))  CBC     Status: Abnormal   Collection Time: 04/30/23  6:35 PM  Result Value Ref Range   WBC 10.7 (H) 4.0 - 10.5 K/uL   RBC 4.50 3.87 - 5.11 MIL/uL   Hemoglobin 13.2 12.0 - 15.0 g/dL   HCT 16.1 09.6 - 04.5 %   MCV 90.7 80.0 - 100.0 fL   MCH 29.3 26.0 - 34.0 pg   MCHC 32.4 30.0 - 36.0 g/dL   RDW 40.9 81.1 - 91.4 %   Platelets 200 150 - 400 K/uL   nRBC 0.0 0.0 - 0.2 %    Comment: Performed at [facility], [address], [place], [place] [address]  Comprehensive metabolic panel     Status: Abnormal   Collection Time: 04/30/23  6:35 PM  Result Value Ref Range   Sodium 139 135 - 145 mmol/L   Potassium 3.3 (L) 3.5 - 5.1 mmol/L   Chloride 105 98 - 111 mmol/L   CO2 24 22 - 32 mmol/L   Glucose, Bld 93 70 - 99 mg/dL    Comment: Glucose reference range applies only to samples taken after fasting for at least 8 hours.   BUN 6 6 - 20 mg/dL   Creatinine, Ser 6.21 0.44 - 1.00 mg/dL   Calcium 8.9 8.9 - 30.8 mg/dL   Total Protein 7.4 6.5 - 8.1 g/dL   Albumin 3.8 3.5 - 5.0 g/dL   AST 10 (L) 15 - 41 U/L   ALT 12 0 - 44 U/L   Alkaline Phosphatase 49 38 - 126 U/L   Total Bilirubin 0.3 0.3 - 1.2 mg/dL   GFR, Estimated >65 >78 mL/min    Comment: (NOTE) Calculated using the CKD-EPI Creatinine Equation (2021)    Anion gap 10 5 - 15    Comment: Performed at [facility], [address] [place], [place] [address]  Acetaminophen level     Status: Abnormal   Collection Time: 04/30/23  6:35 PM  Result Value Ref Range   Acetaminophen (Tylenol), Serum <10 (L) 10 - 30 ug/mL    Comment: (NOTE) Therapeutic concentrations vary significantly. A range of 10-30 ug/mL  may be an effective concentration for many patients. However, some  are best treated at concentrations outside of this range. Acetaminophen concentrations >150 ug/mL at 4 hours after ingestion  and >50 ug/mL at 12 hours after ingestion are  often associated with  toxic reactions.  Performed at [facility], [address] [place], [place] [address]   Salicylate level     Status: Abnormal   Collection Time: 04/30/23  6:35 PM  Result Value Ref Range   Salicylate Lvl <7.0 (L) 7.0 - 30.0 mg/dL    Comment: Performed at [facility], [address] [place], [place] [address]  Lipase, blood     Status: None   Collection Time: 04/30/23  6:35 PM  Result Value Ref Range   Lipase 24 11 - 51 U/L    Comment: Performed at [facility], [address] [place], [place] [address]  Ethanol     Status: Abnormal   Collection Time: 04/30/23  6:35 PM  Result Value Ref Range   Alcohol, Ethyl (B) 115 (H) <10 mg/dL    Comment: (NOTE) Lowest detectable limit for serum alcohol is 10 mg/dL.  For medical purposes only. Performed at [facility], [address], [place], [place] [address]   Urine Drug Screen, Qualitative [facility] [place] only)     Status: Abnormal   Collection Time: 04/30/23  6:35 PM  Result Value Ref Range   Tricyclic, Ur Screen NONE DETECTED NONE DETECTED   Amphetamines, Ur Screen NONE DETECTED NONE DETECTED   MDMA (Ecstasy)Ur Screen NONE DETECTED NONE DETECTED   Cocaine Metabolite,Ur [place] POSITIVE (A) NONE DETECTED   Opiate, Ur Screen NONE DETECTED NONE DETECTED   Phencyclidine (PCP) Ur S NONE DETECTED NONE DETECTED   Cannabinoid 50 Ng, Ur [place] POSITIVE (A) NONE DETECTED   Barbiturates, Ur Screen NONE DETECTED NONE DETECTED   Benzodiazepine, Ur Scrn NONE DETECTED NONE DETECTED   Methadone Scn, Ur NONE DETECTED NONE DETECTED    Comment: (NOTE) Tricyclics + metabolites, urine    Cutoff 1000 ng/mL Amphetamines + metabolites, urine  Cutoff 1000 ng/mL MDMA (Ecstasy), urine              Cutoff 500 ng/mL Cocaine Metabolite, urine          Cutoff 300 ng/mL Opiate + metabolites, urine        Cutoff 300 ng/mL Phencyclidine (PCP), urine         Cutoff 25 ng/mL Cannabinoid, urine                  Cutoff 50 ng/mL Barbiturates + metabolites, urine  Cutoff 200 ng/mL Benzodiazepine, urine              Cutoff 200 ng/mL Methadone, urine                   Cutoff 300 ng/mL  The urine drug screen provides only a preliminary, unconfirmed analytical test result and should not be used for non-medical purposes. Clinical consideration and professional judgment should be applied to any positive drug screen result due to possible interfering substances. A more specific alternate chemical method must be used in order to obtain a confirmed analytical result. Gas chromatography / mass spectrometry (GC/MS) is the preferred confirm atory method. Performed at [facility], [address] [place], [place] [address]   hCG, quantitative, pregnancy     Status: None   Collection Time: 04/30/23  6:35 PM  Result Value Ref Range   hCG, Beta Chain, Quant, S <1 <5 mIU/mL    Comment:          GEST. AGE      CONC.  (mIU/mL)   <=1 WEEK        5 - 50     2 WEEKS       50 - 500     3 WEEKS       100 - 10,000     4 WEEKS     1,000 - 30,000     5 WEEKS     3,500 - 115,000   6-8 WEEKS     12,000 - 270,000    12 WEEKS     15,000 - 220,000  FEMALE AND NON-PREGNANT FEMALE:     LESS THAN 5 mIU/mL Performed at [facility], [address] [place], [place] [address]   Blood gas, venous     Status: Abnormal   Collection Time: 04/30/23  6:35 PM  Result Value Ref Range   pH, Ven 7.32 7.25 - 7.43   pCO2, Ven 52 44 - 60 mmHg   pO2, Ven <31 (LL) 32 - 45 mmHg   Bicarbonate 26.8 20.0 - 28.0 mmol/L   Acid-base deficit 0.1 0.0 - 2.0 mmol/L   O2 Saturation 42.8 %   Patient temperature 37.0    Collection site VEIN     Comment: Performed at [facility], [address] [place], [place] [address]  D-dimer, quantitative     Status: Abnormal   Collection Time: 05/01/23  1:36 PM  Result Value Ref Range   D-Dimer, Quant 0.60 (H) 0.00 - 0.50 ug/mL-FEU    Comment: (NOTE) At the  manufacturer cut-off value of 0.5 g/mL FEU, this assay has a negative predictive value of 95-100%.This assay is intended for use in conjunction with a clinical pretest probability (PTP) assessment model to exclude pulmonary embolism (PE) and deep venous thrombosis (DVT) in outpatients suspected of PE or DVT. Results should be correlated with clinical presentation. Performed at [facility], [address] [place], [place] [address]     No current facility-administered medications for this encounter.   Current Outpatient Medications  Medication Sig Dispense Refill   gabapentin (NEURONTIN) 800 MG tablet Take 800 mg by mouth 4 (four) times daily.     paliperidone (INVEGA SUSTENNA) 234 MG/1.5ML injection Inject 234 mg into the muscle every 30 (thirty) days.     QUEtiapine (SEROQUEL) 100 MG tablet TAKE 1 TABLET BY MOUTH ONCE NIGHTLY. 30 tablet 0   propranolol (INDERAL) 20 MG tablet TAKE 2 TABLETS (40MG ) BY MOUTH ONCE DAILY AS NEEDED FOR ANXIETY. (Patient not taking: Reported on 04/30/2023) 60 tablet 0    Musculoskeletal: Strength & Muscle Tone: within normal limits Gait & Station: normal Patient leans: N/A            Psychiatric Specialty Exam:  Presentation  General Appearance: Disheveled  Eye Contact:Good  Speech:Other (comment) (trach present)  Speech Volume:Other (comment) (trach present)  Handedness:No data recorded  Mood and Affect  Mood:Depressed; Dysphoric  Affect:Tearful   Thought Process  Thought Processes:Coherent  Descriptions of Associations:Intact  Orientation:Full (Time, Place and Person)  Thought Content:Illogical  History of Schizophrenia/Schizoaffective disorder:No data recorded Duration of Psychotic Symptoms:No data recorded Hallucinations:Hallucinations: None  Ideas of Reference:None  Suicidal Thoughts:Suicidal Thoughts: Yes, Active SI Active Intent and/or Plan: With Intent; With Means to Carry Out  Homicidal  Thoughts:Homicidal Thoughts: No   Sensorium  Memory:Immediate Good; Recent Good; Remote Good  Judgment:Poor  Insight:Poor   Executive Functions  Concentration:Good  Attention Span:Good  Recall:Good  Fund of Knowledge:Good  Language:Good   Psychomotor Activity  Psychomotor Activity:Psychomotor Activity: Normal   Assets  Assets:Communication Skills   Sleep  Sleep:No data recorded  Physical Exam: Physical Exam Vitals reviewed.  Neurological:     Mental Status: She is oriented to person, place, and time.    Review of Systems  Psychiatric/Behavioral:  Positive for depression and suicidal ideas.   All other systems reviewed and are negative.  Blood pressure (!) 145/80, pulse 93, temperature 98.2 F (36.8 C), temperature source Axillary, resp. rate 19, height 5\' 5"  (1.651 m), weight (!) 145.2 kg, SpO2 92%. Body mass index is 53.25 kg/m.  Treatment Plan Summary: Daily contact with patient to assess and evaluate symptoms and progress in treatment  Disposition: Recommend psychiatric Inpatient admission when medically cleared.  Mcneil Sober, NP 05/01/2023 4:44 PM

## 2023-05-01 NOTE — ED Notes (Signed)
XR at bedside at this time.

## 2023-05-01 NOTE — ED Provider Notes (Signed)
-----------------------------------------   8:03 PM on 05/01/2023 ----------------------------------------- Patient's COVID/flu/RSV test is negative.  CTA of the chest does not appear to show any significant finding no large PE.  Patient has been maintaining sats in the low to mid 90s on room air.  I believe the patient is medically cleared at this point for psychiatric admission.   Minna Antis, MD 05/01/23 2003

## 2023-05-01 NOTE — ED Notes (Signed)
Pt has supervised 15 min visitor at this time.

## 2023-05-01 NOTE — ED Notes (Signed)
Breakfast tray placed at bedside at this time. Pt currently asleep.

## 2023-05-01 NOTE — ED Notes (Signed)
Lunch meal tray given at this time.  

## 2023-05-01 NOTE — ED Notes (Signed)
Pt sleeping, will obtain vital signs when pt wakes up.

## 2023-05-01 NOTE — ED Notes (Addendum)
Report received from Chad, RN.

## 2023-05-01 NOTE — ED Notes (Signed)
Pt ambulatory to restroom without assistance at this time.  

## 2023-05-01 NOTE — ED Notes (Signed)
IVC  CONSULT  DONE  PENDING  PLACEMENT 

## 2023-05-01 NOTE — ED Notes (Signed)
Pt care taken no complaints at this time.

## 2023-05-01 NOTE — ED Provider Notes (Signed)
Notified of slight desaturation to 87% on RA. RT suctioned patient with greyish mucous. Patient placed on 3L. No respiratory distress noted otherwise. Repeat CXR ordered at this time. Vital signs otherwise stable.  Chest x-ray unremarkable.  D-dimer ordered after repeat suctioning showed patient still slightly hypoxic on her baseline room air.  D-dimer was elevated at this time and CTA chest was ordered.  Patient was signed out to Dr. Lenard Lance pending CTA to evaluate for occult pneumonia or PE. Psych recommending inpatient psychiatric placement which will be decided once patient medically cleared.  Wallis Mart, MD   Janith Lima, MD 05/01/23 [phone]

## 2023-05-01 NOTE — ED Notes (Signed)
Pt transported to CT at this time.

## 2023-05-02 ENCOUNTER — Inpatient Hospital Stay
Admission: AD | Admit: 2023-05-02 | Payer: No Typology Code available for payment source | Source: Intra-hospital | Admitting: Psychiatry

## 2023-05-02 MED ORDER — HYDROXYZINE HCL 25 MG PO TABS
50.0000 mg | ORAL_TABLET | Freq: Three times a day (TID) | ORAL | Status: DC | PRN
  Administered 2023-05-02 – 2023-05-06 (×7): 50 mg via ORAL
  Filled 2023-05-02 (×7): qty 2

## 2023-05-02 MED ORDER — LORAZEPAM 1 MG PO TABS
1.0000 mg | ORAL_TABLET | Freq: Once | ORAL | Status: AC
  Administered 2023-05-02: 1 mg via ORAL
  Filled 2023-05-02: qty 1

## 2023-05-02 MED ORDER — QUETIAPINE FUMARATE 200 MG PO TABS
100.0000 mg | ORAL_TABLET | Freq: Every day | ORAL | Status: DC
  Administered 2023-05-02 – 2023-05-06 (×5): 100 mg via ORAL
  Filled 2023-05-02 (×4): qty 1
  Filled 2023-05-02: qty 4

## 2023-05-02 MED ORDER — TRAZODONE HCL 50 MG PO TABS
50.0000 mg | ORAL_TABLET | Freq: Every evening | ORAL | Status: DC | PRN
  Administered 2023-05-03 – 2023-05-04 (×2): 50 mg via ORAL
  Filled 2023-05-02 (×2): qty 1

## 2023-05-02 NOTE — ED Notes (Signed)
IVC/  PENDING  PLACEMENT 

## 2023-05-02 NOTE — ED Notes (Signed)
Pts friend, [employer], is asking for her to call him when possible.

## 2023-05-02 NOTE — ED Notes (Signed)
Meal tray delivered at this time. 

## 2023-05-02 NOTE — BH Assessment (Signed)
PATIENT BED AVAILABLE AFTER 9AM ON 05/02/23  Patient is to be admitted to [facility] by Psychiatric Nurse Practitioner Lerry Liner.  Attending Physician will be Dr. Marlou Porch.   Patient has been assigned to room 302, by [facility] Charge Nurse [place].    ER staff is aware of the admission: [facility] ER Secretary   Dr. Dolores Frame, ER MD  Truddie Hidden Patient's Nurse

## 2023-05-02 NOTE — Evaluation (Signed)
[facility] Speaking Valve - Evaluation Patient Details  Name: Denise Gibbs MRN: [id number] Date of Birth: 08-19-1996  Today's Date: 05/02/2023 Time: 1015-1110 SLP Time Calculation (min) (ACUTE ONLY): 55 min  Past Medical History:  Past Medical History:  Diagnosis Date   Anxiety    Depression    Past Surgical History:  Past Surgical History:  Procedure Laterality Date   TONSILLECTOMY     TRACHEOSTOMY     HPI:  Pt is a 27 y.o. female with complicated medical history of psychiatric illness as well as tracheostomy for multilevel tracheal stenosis, per [facility] notes.  PMH alos includes: Schizophrenia, MDD, anxiety, morbid Obesity, OSA.  Pt initially presented to the ED for complaints of Suicide Attempt. Current plan is for BMU admission, per MD note today, NSG report.  Pt currently has a PMV which "makes noises when I use it", per pt report.  Per MD note, "she is able to utilize speaking valve off-and-on without difficulty".    CT Angio of Chest: Bilateral lung base opacities greatest medially in the right lower  lobe. Atelectasis is favored over infiltrate. Recommend follow-up.  No pleural effusion.  Some areas of ground-glass and mosaic appearance of the lungs as  well as some areas of bronchial wall thickening.      Pt last followed up w/ [facility] in 11/2022 w/ chart note including this history: "Per chart review patient has a history prolonged hospitalization for pneumonia in November 2022, course complicated by prolonged intubation and multiple failed extubation attempts. She underwent tracheostomy 08/16/2021 by ENT (Dr. Karyl Kinnier) with subsequent decannulation 09/23/21. She was discharged 10/20/21 to rehab, then re-admitted to hospital 11/09/21 for worsening shortness of breath. Ultimately required re-intubation on 4th attempt with 6.0 ETT. She underwent tracheostomy and direct laryngoscopy on 2/24 which found moderate laryngeal edema and subglottic stenosis with inflammatory  changes above the level of the tracheostomy tube with Dr. Gabriel Cirri. She was transitioned off the ventilator on 2/25. Hospital course then complicated by repeated desaturation at night, pulmonology was consulted and recommended placing the patient on AVAPS at night. The patient tolerated minimal trach collar settings and AVAPS at night. The patient was cleared for a regular diet by SLP on 3/15. She was subsequently discharged 12/16/21 with 6 proximal cuffed XLT on BiPAP machine. She is also scheduled to see Pulmonology to discuss BiPAP/ weight loss needed to come off of the machine which will make trach removal much easier.".   Assessment / Plan / Recommendation  Clinical Impression   Pt seen for PMV evaluation this morning. NSG consulted re: pt's status prior to evaluation. Upon entering room, pt awake, verbal but min+ labile. She endorsed min anxiety; NSG was contacted to address. Pt followed commands; described her PMV wear at home for the past year since having the tracheostomy.   Pt has a Shiley 6 proximal XLT cuffless trach. Tracheostomy was performed ~1 year ago and she last followed up w/ [facility] Ot[facility]. She wears a PMV at Baseline during the day. Pt c/o her current PMV "makes noises" when she talks. Pt also endorsed Baseline increased effort during exhalation "b/c of my tracheal stenosis"(baseline).  Pt seen for PMV evaluation for toleration of PMV wear/use for verbal communication w/ others and to promote Pulmonary strengthening. Pt is on RA, WBC 10.7. Afebrile.       Trach/stoma area assessed; noted trach appeared dirty, and pt c/o dry cough d/t feelings of "phlegm, something there"- appeared to be dry coughing w/ no production of  phlegm. RT contacted for trach care/change as needed and f/u for humidification to help w/ the dry cough and phlegm feelings. Explained the use and wear of the PMV to pt. Pt was able to phonate adequately (min low volume, gravely) w/ Finger Occlusion x3. Cuffless  trach.  PMV was then placed w/out difficulty. Pt's respiratory effort appeared to remain at its baseline: RR: 18-22, O2 sats 99-100%, HR calm except when min labile(NSG aware). Pt verbalize to answer basic questions from this Clinician re: self, tracheostomy history. Verbalizations and sentences were c/b min reduced volume and breath support effort during exhalation(min); vocal quality min gravely/hoarse -- pt stated this is her Baseline Vocal Quality d/t the "stenosis I have"(baseline). Intelligibility was good. Encouraged pt to focus on using calm breathing and breath support to support her speech/volume which she was able to do w/ cues/support from this Clinician. PMV placement was tolerated during session w/out any noted O2 desaturation, discomfort in her Baseline breathing, or significant change in RR/HR from baseline. Pt stated it felt "like usual" to wear/talk w/ PMV placed. No overt increased effort noted during wear/talking; no overt use of accessary muscles or distress was noted in his breathing pattern. PMV was allowed to remain on post session. NSG updated.   Pt appears to adequately tolerate PMV placement at her Baseline w/out overt respiratory discomfort or distress; ANS remained stable at Baseline during wear/use.    Education was given on PMV use/wear/care. Checking and cleaning b/f placing, placing/removing the PMV when coughing or any difficulty breathing, and overall care of the PMV. Discussed that it must NOT be worn when sleeping. Encouraged Rest Breaks at times during the day. Monitoring her secretions and dryness -- see Humidification in the home to decrease dryness. Precautions posted at bedside and in chart.  NSG made aware. MD updated. ST services will sign off at this time as pt appears at her Baseline w/ use/wear of PMV secondary to tracheostomy and tracheal stenosis(baseline). Pt agreed w/ above. SLP Visit Diagnosis: Aphonia (R49.1) (tracheostomy baseline)    SLP Assessment   Patient does not need any further Speech Lanaguage Pathology Services (appears at her baseline)    Recommendations for follow up therapy are one component of a multi-disciplinary discharge planning process, led by the attending physician.  Recommendations may be updated based on patient status, additional functional criteria and insurance authorization.  Follow Up Recommendations  No SLP follow up - appears at her baseline; pt agreed.    Assistance Recommended at Discharge PRN (while admitted)  Functional Status Assessment Patient has had a recent decline in their functional status and demonstrates the ability to make significant improvements in function in a reasonable and predictable amount of time.  Frequency and Duration  (n/a)   (n/a)    PMSV Trial PMSV was placed for: ~25 mins, then left on pt post session Able to redirect subglottic air through upper airway: Yes Able to Attain Phonation: Yes Voice Quality: Hoarse;Low vocal intensity (min; Gravely) Able to Expectorate Secretions: No attempts Level of Secretion Expectoration with PMSV: Not observed Breath Support for Phonation: Mildly decreased (pt stated baseline increased exhalation effort - d/t stenosis baseline) Intelligibility: Intelligible Respirations During Trial: 21 SpO2 During Trial: 99 % Pulse During Trial:  (calm) Behavior: Alert;Cooperative;Expresses self well;Labile;Good eye contact;Responsive to questions   Tracheostomy Tube  Additional Tracheostomy Tube Assessment Trach Collar Period: n/a Secretion Description: minimal Frequency of Tracheal Suctioning: PRN Level of Secretion Expectoration: Tracheal    Vent Dependency  Vent Dependent: No  Cuff Deflation Trial Tolerated Cuff Deflation:  (n/a)         Jerilynn Som, MS, CCC-SLP Speech Language Pathologist Rehab Services; [facility] [phone] (ascom)        Cristofher Livecchi 05/02/2023, 11:33 AM

## 2023-05-02 NOTE — BH Assessment (Signed)
PATIENT BED AVAILABLE AT 9AM ON 05/03/23  Patient has been accepted to [facility].  Patient assigned to [facility] Accepting physician is Dr. Sofie Hartigan.  Call report to [phone].  Representative was [place].   ER Staff is aware of it:  [facility] ER Secretary  Dr. Larinda Buttery, ER MD  [facility] Patient's Nurse

## 2023-05-02 NOTE — BH Assessment (Signed)
Adult MH  Referral information for Psychiatric Hospitalization faxed to:   [facility] ([phone]-or- [phone]),   [facility] ([phone]),   [facility] ([phone] -or- [phone]),   [facility] ([phone]---[phone]---[phone]),   [place] ([phone] or [phone])   [place] ([phone] or [phone]),   [place] ([phone]) 

## 2023-05-02 NOTE — ED Provider Notes (Signed)
Emergency Medicine Observation Re-evaluation Note  Denise Gibbs is a 27 y.o. female, seen on rounds today.  Pt initially presented to the ED for complaints of Suicide Attempt Currently, the patient is resting, voices no medical complaints.  Physical Exam  BP 133/83   Pulse 66   Temp 98.2 F (36.8 C)   Resp 18   Ht 5\' 5"  (1.651 m)   Wt (!) 145.2 kg   LMP  (LMP Unknown)   SpO2 100%   BMI 53.25 kg/m  Physical Exam General: Resting in no acute distress Cardiac: No cyanosis Lungs: Equal rise and fall Psych: Not agitated  ED Course / MDM  EKG:EKG Interpretation Date/Time:  Monday April 30 2023 17:11:23 EDT Ventricular Rate:  99 PR Interval:  199 QRS Duration:  107 QT Interval:  340 QTC Calculation: 439 R Axis:   103  Text Interpretation: Sinus tachycardia Borderline prolonged PR interval Borderline right axis deviation Nonspecific repol abnormality, inferior leads Baseline wander in lead(s) V1 Confirmed by UNCONFIRMED, DOCTOR ([id number]), editor Lonell Face ([id number]) on 05/01/2023 7:25:05 AM  I have reviewed the labs performed to date as well as medications administered while in observation.  Recent changes in the last 24 hours include no events overnight.  Plan  Current plan is for BMU admission after shift change.    Irean Hong, MD 05/02/23 [phone]

## 2023-05-02 NOTE — ED Notes (Signed)
Pt extremely tearful crying stating "I don't want to be here. I can't be stuck here. I won't kill myself, I promise. I learned my lesson". MD notified.

## 2023-05-02 NOTE — ED Notes (Signed)
RT at bedside for trach care.

## 2023-05-02 NOTE — ED Notes (Signed)
Pt resting quietly in bed at this time.

## 2023-05-02 NOTE — ED Notes (Signed)
Pt adamantly verbalizing that she has no plans of killing herself, that she learned her lesson, and that it was a stupid decision.

## 2023-05-02 NOTE — ED Notes (Signed)
Patients significant other's [employer]) phone numbers: [phone]

## 2023-05-02 NOTE — ED Notes (Signed)
Ivc/pt admitted to [facility] BMU on 05/12/23 after 9am.

## 2023-05-02 NOTE — ED Notes (Signed)
Pt is on room air

## 2023-05-02 NOTE — BH Assessment (Signed)
**   Please disregard patient admission to [facility] ** Patient referral has been faxed to several facilities.

## 2023-05-03 DIAGNOSIS — T50902A Poisoning by unspecified drugs, medicaments and biological substances, intentional self-harm, initial encounter: Secondary | ICD-10-CM | POA: Diagnosis not present

## 2023-05-03 DIAGNOSIS — F1220 Cannabis dependence, uncomplicated: Secondary | ICD-10-CM | POA: Diagnosis not present

## 2023-05-03 DIAGNOSIS — F1410 Cocaine abuse, uncomplicated: Secondary | ICD-10-CM

## 2023-05-03 DIAGNOSIS — F332 Major depressive disorder, recurrent severe without psychotic features: Secondary | ICD-10-CM

## 2023-05-03 NOTE — ED Notes (Signed)
Lyla Son, RN at [facility] reports they do not take patients with tracheostomies. She reports the third party did not see her tracheostomy documented and should not have accepted her.

## 2023-05-03 NOTE — ED Notes (Signed)
Patient has been accepted to [facility].  Patient assigned to [facility]. Bed will be available at 9:00 a.m.

## 2023-05-03 NOTE — ED Notes (Signed)
IVC/  PENDING  PLACEMENT 

## 2023-05-03 NOTE — ED Provider Notes (Signed)
Emergency Medicine Observation Re-evaluation Note  Denise Gibbs is a 27 y.o. female, currently awaiting psychiatric placement. No acute event since last update.  Physical Exam  BP (!) 106/56 (BP Location: Left Arm)   Pulse 74   Temp 98.6 F (37 C) (Oral)   Resp 20   Ht 5\' 5"  (1.651 m)   Wt (!) 145.2 kg   LMP  (LMP Unknown)   SpO2 96%   BMI 53.25 kg/m    ED Course / MDM   Lab work from 2 days ago shows a negative COVID/flu/RSV slightly elevated D-dimer but negative CTA of the chest.  Reassuring CBC and chemistry.  Plan  Current plan is for placement to an appropriate inpatient psychiatric facility once available.  Patient has a tracheostomy which is making placement somewhat difficult.    Minna Antis, MD 05/03/23 [phone]

## 2023-05-03 NOTE — ED Notes (Signed)
Pt's mood became elevated while on the phone with her fiance, this nurse went in to deescalate the situation by removing the phone from the room. Pt then expressed a desire for something to sleep due to inability to sleep, but fell asleep shortly after this nurse left the room.

## 2023-05-03 NOTE — BH Assessment (Signed)
Patient declined with [facility] due "Trach."

## 2023-05-03 NOTE — ED Notes (Signed)
Pt given a snack and drink ?
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
## 2023-05-03 NOTE — BH Assessment (Signed)
Patient bed was rescind by [facility].  TTS followed up with other referrals;     Denise Gibbs (Justin-[phone]-or- [phone]), Declined due to "Behavioral."    [facility] [phone]), rescinded bed offer.    [name] [phone] -[phone]), information refax.    Denise Gibbs ([phone]), Left voicemail message requesting return phone call.    [place] [phone] or [phone]), left voicemail message requesting return phone call.    Denise Gibbs [phone] or [phone]), [place] only.    Denise Gibbs [phone]), unable to reach anyone.

## 2023-05-03 NOTE — ED Notes (Signed)
Breakfast tray provided. 

## 2023-05-03 NOTE — ED Notes (Signed)
Pt given dinner tray and beverage  

## 2023-05-03 NOTE — ED Provider Notes (Signed)
Emergency Medicine Observation Re-evaluation Note  Physical Exam   BP 136/84   Pulse 88   Temp 98.2 F (36.8 C) (Oral)   Resp 18   Ht 5\' 5"  (1.651 m)   Wt (!) 145.2 kg   LMP  (LMP Unknown)   SpO2 98%   BMI 53.25 kg/m   Patient appears in no acute distress.  ED Course / MDM   No reported events during my shift at the time of this note.   Pt is awaiting dispo from SW   Pilar Jarvis MD    Pilar Jarvis, MD 05/03/23 [phone]

## 2023-05-04 ENCOUNTER — Encounter: Payer: Self-pay | Admitting: Psychiatry

## 2023-05-04 DIAGNOSIS — F332 Major depressive disorder, recurrent severe without psychotic features: Secondary | ICD-10-CM | POA: Insufficient documentation

## 2023-05-04 DIAGNOSIS — T50902A Poisoning by unspecified drugs, medicaments and biological substances, intentional self-harm, initial encounter: Secondary | ICD-10-CM | POA: Diagnosis not present

## 2023-05-04 NOTE — ED Notes (Signed)
Pt given dinner tray.

## 2023-05-04 NOTE — ED Notes (Signed)
Assumed care of pt at this time. Pt is resting comfortably in bed, RR and WOB WNL. No needs identified at this time. Pt is in a safe environment. PSA within view.

## 2023-05-04 NOTE — ED Notes (Signed)
Pt using phone to talk to fiance

## 2023-05-04 NOTE — Consult Note (Addendum)
  Patient noted sitting in the bed.  She is alert and oriented, pleasant, and willing to engage.  She appears anxious with labile mood.  Patient noted crying at times, stating I need to get out of here, it is messing with me that I cannot see my fianc".  Upon presentation to ED patient reports that her fianc walked out of the home which prompted her to intentionally overdose on her medications.  Patient previously recommended for inpatient psychiatry.  She denies suicidal ideations, homicidal ideations, delusional thoughts or auditory hallucinations.  Patient reports having visual hallucinations at times seeing shadows.  Patient still awaiting placement for inpatient psychiatry.

## 2023-05-04 NOTE — ED Notes (Signed)
Pt now calming sitting in bed doing word search. Pt informed psych team will make rounds with her today.

## 2023-05-04 NOTE — ED Notes (Signed)
This NT provided pt with pm snack. 

## 2023-05-04 NOTE — ED Provider Notes (Signed)
Emergency Medicine Observation Re-evaluation Note  Denise Gibbs is a 27 y.o. female, seen on rounds today.  Pt initially presented to the ED for complaints of Suicide Attempt Currently, the patient is resting in no acute distress.  Physical Exam  BP (!) 106/56 (BP Location: Left Arm)   Pulse 74   Temp 98.6 F (37 C) (Oral)   Resp 20   Ht 5\' 5"  (1.651 m)   Wt (!) 145.2 kg   LMP  (LMP Unknown)   SpO2 96%   BMI 53.25 kg/m  Physical Exam General: Resting, voices no medical complaints Cardiac: No cyanosis Lungs: Equal rise and fall Psych: Not agitated  ED Course / MDM  EKG:EKG Interpretation Date/Time:  Monday April 30 2023 17:11:23 EDT Ventricular Rate:  99 PR Interval:  199 QRS Duration:  107 QT Interval:  340 QTC Calculation: 439 R Axis:   103  Text Interpretation: Sinus tachycardia Borderline prolonged PR interval Borderline right axis deviation Nonspecific repol abnormality, inferior leads Baseline wander in lead(s) V1 Confirmed by UNCONFIRMED, DOCTOR ([id number]), editor Lonell Face [id number]) on 05/01/2023 7:25:05 AM  I have reviewed the labs performed to date as well as medications administered while in observation.  Recent changes in the last 24 hours include no events overnight.  Plan  Current plan is for psychiatric disposition.    Irean Hong, MD 05/04/23 [phone]

## 2023-05-04 NOTE — ED Notes (Signed)
Respiratory at bedside to change pts inner cannula

## 2023-05-04 NOTE — ED Notes (Signed)
Pt visiting with fiance. Security and this RN nearby for monitoring purposes

## 2023-05-04 NOTE — ED Notes (Signed)
INVOLUNTARY AWAITING PLACEMENT

## 2023-05-04 NOTE — ED Notes (Addendum)
Pt was crying in bed and keeps repeating that she wants to go home and is not suicidal.States she is "being held hostage." Therapeutic communication given, divisional activities offered and pt accepted sodoku. Psych NP notified

## 2023-05-04 NOTE — ED Notes (Signed)
Patient is crying saying " I don't want to die in the hospital, I want to go home." RN was present. Patient wants anxiety meds.

## 2023-05-05 DIAGNOSIS — F1410 Cocaine abuse, uncomplicated: Secondary | ICD-10-CM | POA: Diagnosis not present

## 2023-05-05 DIAGNOSIS — T50902A Poisoning by unspecified drugs, medicaments and biological substances, intentional self-harm, initial encounter: Secondary | ICD-10-CM | POA: Diagnosis not present

## 2023-05-05 DIAGNOSIS — F332 Major depressive disorder, recurrent severe without psychotic features: Secondary | ICD-10-CM | POA: Diagnosis not present

## 2023-05-05 DIAGNOSIS — F1220 Cannabis dependence, uncomplicated: Secondary | ICD-10-CM | POA: Diagnosis not present

## 2023-05-05 NOTE — ED Notes (Signed)
Pt given water and sandwich tray for snack.

## 2023-05-05 NOTE — ED Notes (Signed)
Patient is IVC pending placement 

## 2023-05-05 NOTE — ED Notes (Signed)
Pt was given dinner tray.  

## 2023-05-05 NOTE — ED Notes (Signed)
Pt is sleeping

## 2023-05-05 NOTE — Group Note (Deleted)
Date:  05/05/2023 Time:  2:12 AM  Group Topic/Focus:  Wrap-Up Group:   The focus of this group is to help patients review their daily goal of treatment and discuss progress on daily workbooks.     Participation Level:  {BHH PARTICIPATION ZOXWR:60454}  Participation Quality:  {BHH PARTICIPATION QUALITY:22265}  Affect:  {BHH AFFECT:22266}  Cognitive:  {BHH COGNITIVE:22267}  Insight: {BHH Insight2:20797}  Engagement in Group:  {BHH ENGAGEMENT IN UJWJX:91478}  Modes of Intervention:  {BHH MODES OF INTERVENTION:22269}  Additional Comments:  ***  Maglione, E 05/05/2023, 2:12 AM

## 2023-05-05 NOTE — ED Notes (Signed)
IVC pending placement 

## 2023-05-05 NOTE — ED Provider Notes (Signed)
Emergency Medicine Observation Re-evaluation Note  Physical Exam   BP 108/67 (BP Location: Left Arm)   Pulse 72   Temp 98.3 F (36.8 C) (Oral)   Resp 20   Ht 5\' 5"  (1.651 m)   Wt (!) 145.2 kg   LMP  (LMP Unknown)   SpO2 96%   BMI 53.25 kg/m   Patient appears in no acute distress.  ED Course / MDM   No reported events during my shift at the time of this note.   Pt is awaiting dispo from SW   Pilar Jarvis MD    Pilar Jarvis, MD 05/05/23 [phone]

## 2023-05-05 NOTE — ED Notes (Signed)
Delay in getting new VS due to pt sleeping. Will obtain VS when pt awakes.

## 2023-05-05 NOTE — ED Notes (Signed)
Provided pt with phone as requested

## 2023-05-05 NOTE — ED Provider Notes (Signed)
Emergency Medicine Observation Re-evaluation Note  Denise Gibbs is a 27 y.o. female, seen on rounds today.  Pt initially presented to the ED for complaints of Suicide Attempt  Currently, the patient is resting in bed. No reported issues from nursing team.   Physical Exam  BP 135/76 (BP Location: Right Arm)   Pulse 72   Temp 98.2 F (36.8 C) (Oral)   Resp 18   Ht 5\' 5"  (1.651 m)   Wt (!) 145.2 kg   LMP  (LMP Unknown)   SpO2 97%   BMI 53.25 kg/m  Physical Exam General: Resting in bed  ED Course / MDM    Plan  Current plan is for dispo per psychiatry, recommending inpatient admission.    Trinna Post, MD 05/05/23 [phone]

## 2023-05-05 NOTE — ED Notes (Signed)
Pt given breakfast tray and beverage.  

## 2023-05-05 NOTE — ED Notes (Signed)
Pt was found crying in her room after speaking to her fiance over the phone. The pt expressed "she wants to go home and what she did was stupid".This nurse contacted the Psych provider to express the pt's concerns. The psych provider responded by saying she will see the pt as soon as she can.

## 2023-05-05 NOTE — ED Notes (Addendum)
Verbal Consent from pt to give out info to [place], the friend, happened on 05/05/2023 @ 1420.
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
## 2023-05-05 NOTE — BH Assessment (Signed)
Referral Checks:   Earlene Plater ([phone]), Requested a refax. Task completed at 6:18 AM.     [place] [phone] or [phone]), left voicemail message requesting return phone call.    [facility] [phone]), unable to reach anyone.  Referral information for Psychiatric Hospitalization faxed to additional facilities;   [facility] [phone], [phone], [phone] or [phone]),   [facility] [phone] phone-- [phone])  [facility] [phone]),  [facility] [phone])  [facility] [phone])

## 2023-05-06 DIAGNOSIS — T50902A Poisoning by unspecified drugs, medicaments and biological substances, intentional self-harm, initial encounter: Secondary | ICD-10-CM | POA: Diagnosis not present

## 2023-05-06 DIAGNOSIS — F1220 Cannabis dependence, uncomplicated: Secondary | ICD-10-CM | POA: Diagnosis not present

## 2023-05-06 DIAGNOSIS — F1410 Cocaine abuse, uncomplicated: Secondary | ICD-10-CM | POA: Diagnosis not present

## 2023-05-06 DIAGNOSIS — F332 Major depressive disorder, recurrent severe without psychotic features: Secondary | ICD-10-CM | POA: Diagnosis not present

## 2023-05-06 MED ORDER — ACETAMINOPHEN 500 MG PO TABS
1000.0000 mg | ORAL_TABLET | Freq: Once | ORAL | Status: AC
  Administered 2023-05-06: 1000 mg via ORAL
  Filled 2023-05-06: qty 2

## 2023-05-06 NOTE — ED Notes (Signed)
Pt to door requesting anxiety medicine. PRN given, see MAR

## 2023-05-06 NOTE — ED Notes (Signed)
Pt given breakfast tray
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
## 2023-05-06 NOTE — ED Notes (Signed)
Pt given snack. 

## 2023-05-07 DIAGNOSIS — T50902A Poisoning by unspecified drugs, medicaments and biological substances, intentional self-harm, initial encounter: Secondary | ICD-10-CM | POA: Diagnosis not present
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
## 2023-05-07 NOTE — ED Provider Notes (Signed)
27 year old female who presented for suicidal ideation.  She was IVC by psychiatry team.  Patient is now psychiatrically cleared.  She was medically cleared earlier in her hospitalization stay.  Plan for discharge at this time.  Reassessed patient with no acute distress and vital signs stable.   Janith Lima, MD 05/07/23 1025

## 2023-05-07 NOTE — ED Notes (Signed)
Delay in getting VS at this time due to pt sleeping. Will retrieve VS when pt wakes up.

## 2023-05-07 NOTE — Discharge Instructions (Signed)
Please follow-up as planned with the psychiatry team.  Please follow-up with your primary care provider as needed.  Please return for any new or worsening symptoms.

## 2023-05-07 NOTE — ED Provider Notes (Signed)
Emergency Medicine Observation Re-evaluation Note  Kristilyn Mcgiffin is a 27 y.o. female, seen on rounds today.  Pt initially presented to the ED for complaints of Suicide Attempt  Currently, the patient is is no acute distress. Denies any concerns at this time.  Physical Exam  Blood pressure (!) 111/54, pulse 68, temperature 98.2 F (36.8 C), temperature source Oral, resp. rate 19, height 5\' 5"  (1.651 m), weight (!) 145.2 kg, SpO2 96%.  Physical Exam: General: No apparent distress Pulm: Normal WOB Neuro: Moving all extremities Psych: Resting comfortably     ED Course / MDM   Clinical Course as of 05/07/23 0717  Mon Apr 30, 2023  1919 Patient awake alert oriented, much more compliant with care request at this time. [MQ]  Tue May 01, 2023  0520 Pt is medically cleared for psychiatric placement at this time [EB]    Clinical Course User Index [EB] Vicente Males Clent Jacks, MD [MQ] Sharyn Creamer, MD    I have reviewed the labs performed to date as well as medications administered while in observation.  Recent changes in the last 24 hours include: No acute events overnight.  Plan   Current plan: Patient awaiting psychiatric disposition. Patient is under full IVC at this time.    Timberly Yott, Layla Maw, DO 05/07/23 [phone]

## 2023-05-07 NOTE — ED Notes (Signed)
Went over d/c paperwork at this time with patient. Pt had no questions, comments or concerns after review and verbally understood them. The IVC has been lifted.

## 2023-05-07 NOTE — ED Notes (Signed)
IVC/pending placement/Papers need to be renewed today by 4PM

## 2023-05-07 NOTE — ED Notes (Signed)
IVC papers  rescinded  per  Willeen Cass  NP

## 2023-05-17 ENCOUNTER — Other Ambulatory Visit: Payer: Self-pay

## 2023-05-17 ENCOUNTER — Emergency Department
Admission: EM | Admit: 2023-05-17 | Discharge: 2023-05-17 | Disposition: A | Discharge status: Home / Self Care | Payer: MEDICAID | Attending: Emergency Medicine | Admitting: Emergency Medicine

## 2023-05-17 ENCOUNTER — Emergency Department: Payer: MEDICAID

## 2023-05-17 ENCOUNTER — Encounter: Payer: Self-pay | Admitting: Emergency Medicine

## 2023-05-17 DIAGNOSIS — S6992XA Unspecified injury of left wrist, hand and finger(s), initial encounter: Secondary | ICD-10-CM | POA: Diagnosis present

## 2023-05-17 DIAGNOSIS — S62339A Displaced fracture of neck of unspecified metacarpal bone, initial encounter for closed fracture: Secondary | ICD-10-CM | POA: Diagnosis not present

## 2023-05-17 DIAGNOSIS — S62326A Displaced fracture of shaft of fifth metacarpal bone, right hand, initial encounter for closed fracture: Secondary | ICD-10-CM

## 2023-05-17 DIAGNOSIS — W228XXA Striking against or struck by other objects, initial encounter: Secondary | ICD-10-CM | POA: Diagnosis not present

## 2023-05-17 DIAGNOSIS — S62327A Displaced fracture of shaft of fifth metacarpal bone, left hand, initial encounter for closed fracture: Secondary | ICD-10-CM | POA: Insufficient documentation

## 2023-05-17 DIAGNOSIS — I10 Essential (primary) hypertension: Secondary | ICD-10-CM | POA: Diagnosis not present

## 2023-05-17 NOTE — ED Provider Notes (Signed)
[facility] Provider Note    Event Date/Time   First MD Initiated Contact with Patient 05/17/23 1409     (approximate)   History   Hand Injury   HPI  Denise Gibbs is a 27 y.o. female with a past medical history of obesity, anxiety, polysubstance abuse, depression, trach who presents today because she has left hand pain.  Patient reports that she punched a wall couple days ago because she was upset.  She reports that she is no longer upset, however she has continued to have swelling to the outside part of her hand.  No numbness or tingling.  No open wounds.  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   Neuropathy 10/26/2021   Hypercalcemia 10/10/2021   Normocytic anemia 09/03/2021   Hypertension 08/04/2021   Morbid obesity with BMI of 50.0-59.9, adult (HCC) 08/01/2021   Anxiety 05/11/2018   Alcohol abuse 07/05/2016   Cocaine abuse (HCC) 06/30/2015   Opioid use disorder, mild, abuse (HCC) 06/30/2015   Cannabis use disorder, moderate, dependence (HCC) 06/30/2015   Tobacco use disorder 06/30/2015   Borderline traits 06/30/2015          Physical Exam   Triage Vital Signs: ED Triage Vitals  Encounter Vitals Group     BP 05/17/23 1405 137/85     Systolic BP Percentile --      Diastolic BP Percentile --      Pulse Rate 05/17/23 1405 87     Resp 05/17/23 1405 18     Temp 05/17/23 1405 98 F (36.7 C)     Temp Source 05/17/23 1405 Oral     SpO2 05/17/23 1405 98 %     Weight 05/17/23 1406 (!) 317 lb (143.8 kg)     Height 05/17/23 1406 5\' 5"  (1.651 m)      Head Circumference --      Peak Flow --      Pain Score 05/17/23 1406 6     Pain Loc --      Pain Education --      Exclude from Growth Chart --     Most recent vital signs: Vitals:   05/17/23 1405 05/17/23 1408  BP: 137/85 137/85  Pulse: 87 91  Resp: 18 20  Temp: 98 F (36.7 C) 98.6 F (37 C)  SpO2: 98% 98%    Physical Exam Vitals and nursing note reviewed.  Constitutional:      General: Awake and alert. No acute distress.    Appearance: Normal appearance. The patient is obese.  HENT:     Head: Normocephalic and atraumatic.     Mouth: Mucous membranes are moist.  Trach in place Eyes:     General: PERRL. Normal EOMs        Right eye: No discharge.        Left eye: No discharge.     Conjunctiva/sclera: Conjunctivae normal.  Cardiovascular:     Rate and Rhythm: Normal rate and regular rhythm.     Pulses: Normal pulses.  Pulmonary:     Effort: Pulmonary effort is normal. No respiratory distress.     Breath sounds: Normal breath  sounds.  Abdominal:     Abdomen is soft. There is no abdominal tenderness. No rebound or guarding. No distention. Musculoskeletal:        General: No swelling. Normal range of motion.     Cervical back: Normal range of motion and neck supple.  Right hand: Tenderness with mild swelling over the fifth metacarpal, dorsal side of hand.  No malrotation.  Normal range of motion of all fingers.  Normal grip strength.  Normal radial pulse.  No tenderness at the wrist. Skin:    General: Skin is warm and dry.     Capillary Refill: Capillary refill takes less than 2 seconds.     Findings: No rash.  Neurological:     Mental Status: The patient is awake and alert.      ED Results / Procedures / Treatments   Labs (all labs ordered are listed, but only abnormal results are displayed) Labs Reviewed - No data to display   EKG     RADIOLOGY I independently reviewed and interpreted imaging and agree with radiologists  findings.     PROCEDURES:  Critical Care performed:   Procedures   MEDICATIONS ORDERED IN ED: Medications - No data to display   IMPRESSION / MDM / ASSESSMENT AND PLAN / ED COURSE  I reviewed the triage vital signs and the nursing notes.   Differential diagnosis includes, but is not limited to, boxer's fracture, dislocation, contusion.  Patient is awake and alert, hemodynamically stable and afebrile.  She is nontoxic in appearance.  She is neurovascular intact.  There is no malrotation.  She has normal intrinsic muscle function of the hand.  No open wounds. She denies any suicidal homicidal thoughts today.  She does not wish to harm herself.  X-ray was obtained and reveals acute mildly displaced oblique fracture of the proximal shaft of the fifth metacarpal.  There is no malrotation on exam.  She was placed in an ulnar gutter splint.  I advised close outpatient follow-up with orthopedics and the appropriate follow-up information was provided.  We discussed splint care.  She also understands return precautions.  She was discharged in stable condition.   Patient's presentation is most consistent with acute complicated illness / injury requiring diagnostic workup.    FINAL CLINICAL IMPRESSION(S) / ED DIAGNOSES   Final diagnoses:  Closed boxer's fracture, initial encounter  Closed displaced fracture of shaft of fifth metacarpal bone of right hand, initial encounter     Rx / DC Orders   ED Discharge Orders     None        Note:  This document was prepared using Dragon voice recognition software and may include unintentional dictation errors.   Keturah Shavers 05/17/23 1508    Chesley Noon, MD 05/18/23 [phone]

## 2023-05-17 NOTE — Discharge Instructions (Signed)
You have a fifth metacarpal fracture as we discussed.  Please keep your splint clean and dry.  Please follow-up with hand surgery.  Please return for any new, worsening, or change in symptoms or other concerns.

## 2023-05-17 NOTE — ED Triage Notes (Signed)
Presents with pain and swelling to left hand  States she hit a wall 2 days ago

## 2023-09-22 IMAGING — CR DG CHEST 2V
2 series · 2 of 2 positions shown · non-contrast
Comparison: None

CLINICAL DATA: Shortness of breath.

EXAM:
CHEST - 2 VIEW

[chest lat]
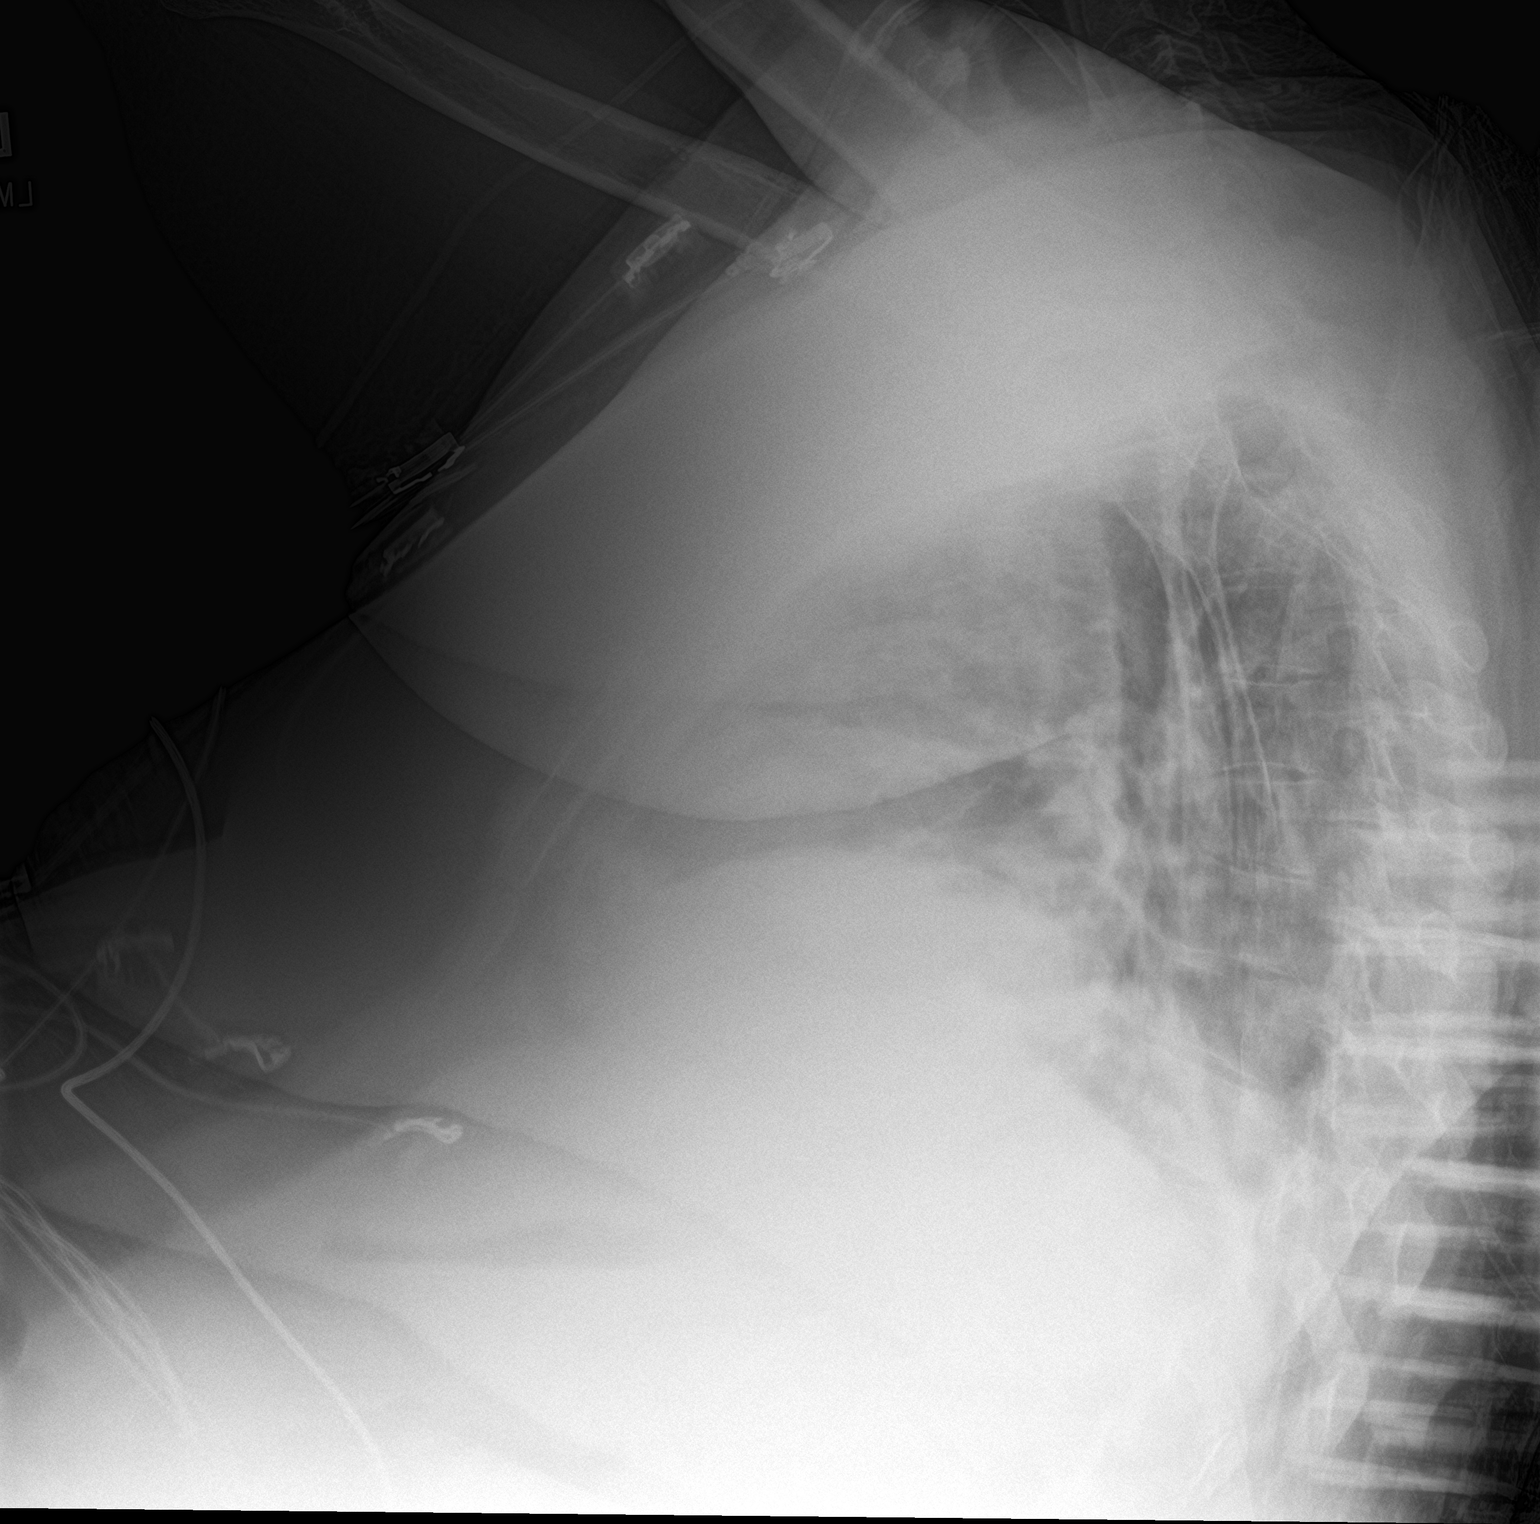

[chest ap]
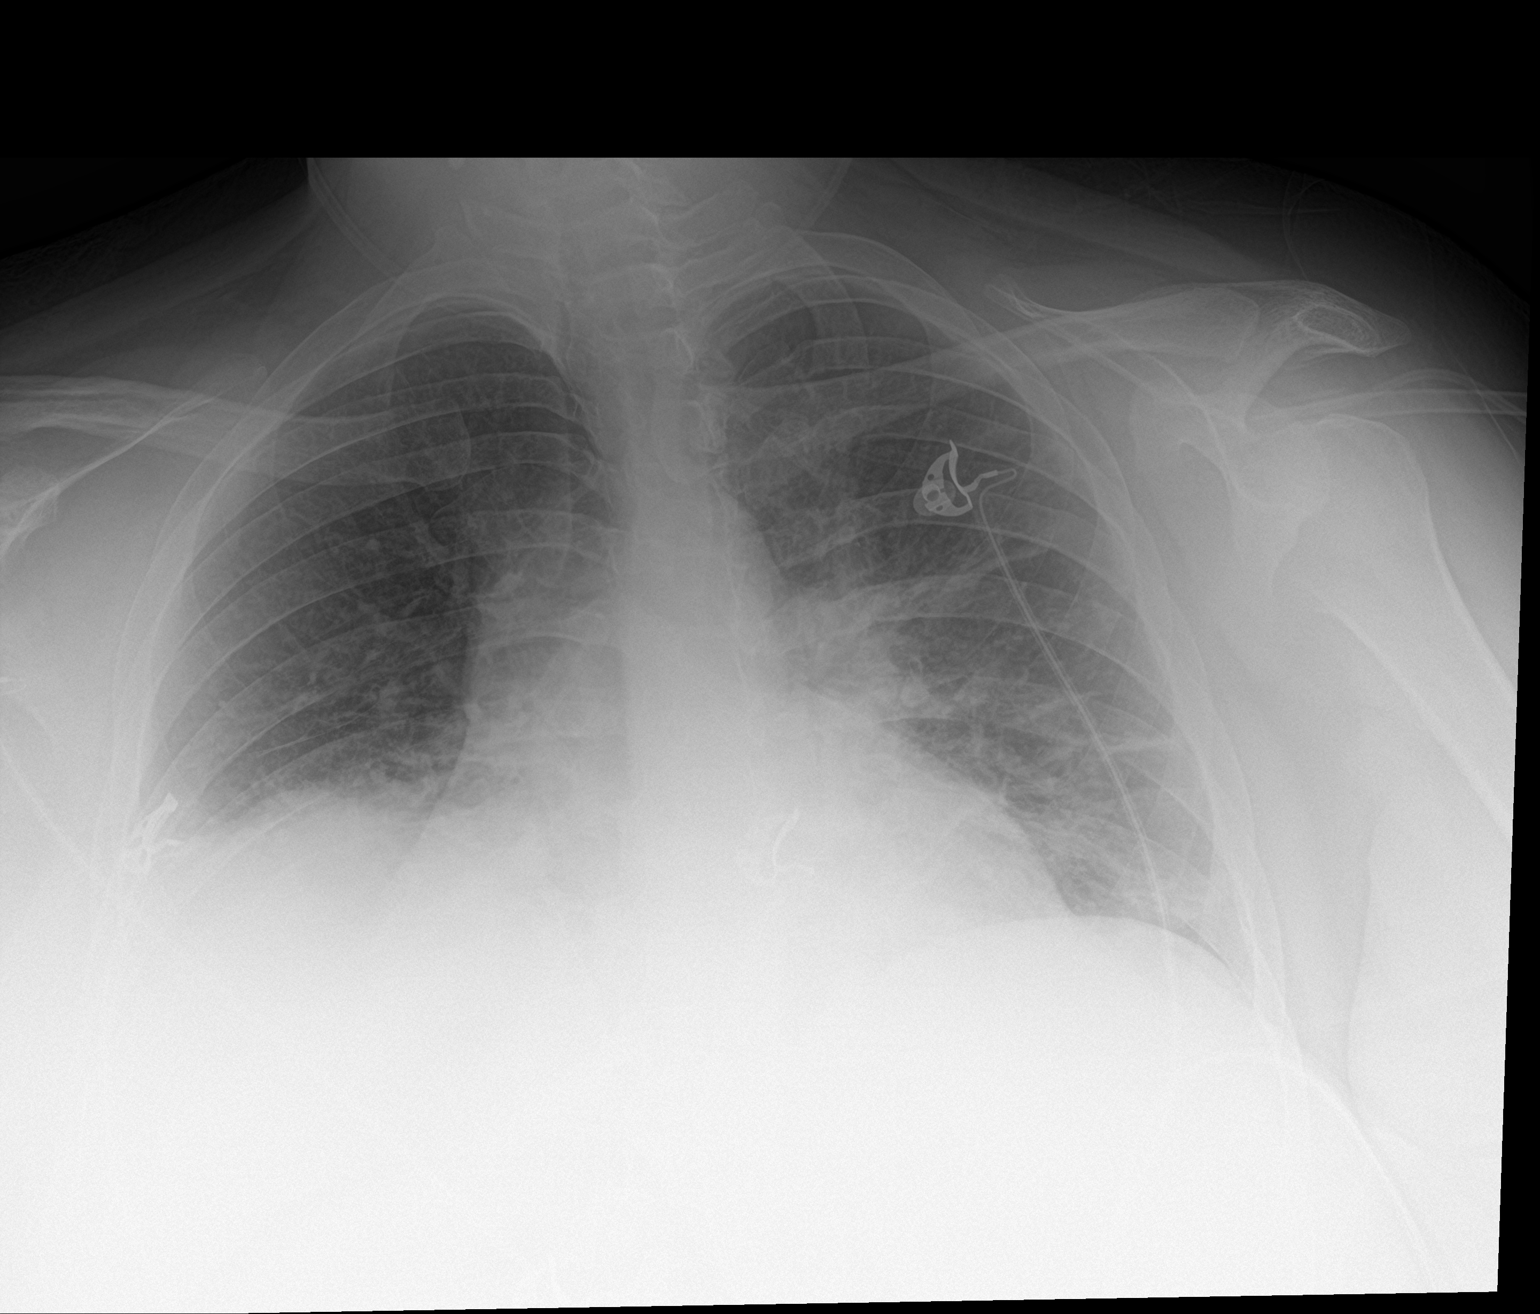

[2 of 2 positions shown; findings below may reference images not displayed]

FINDINGS: Shallow inspiration. Right lung base atelectasis. Left mid to lower
lung field streaky and platelike densities may represent atelectasis
but concerning for infiltrate. No large pleural effusion or
pneumothorax. Cardiomegaly with vascular congestion. No acute
osseous pathology.
IMPRESSION: 1. Cardiomegaly with mild vascular congestion.
2. Left mid to lower lung field atelectasis versus infiltrate.
